# Patient Record
Sex: Male | Born: 1990 | Hispanic: No | Marital: Single | Smoking: Light tobacco smoker
Health system: Southern US, Community
[De-identification: ages and names within clinical notes are randomized; demographics above are authoritative.]

## PROBLEM LIST (undated history)

## (undated) DIAGNOSIS — K259 Gastric ulcer, unspecified as acute or chronic, without hemorrhage or perforation: Secondary | ICD-10-CM

## (undated) DIAGNOSIS — K922 Gastrointestinal hemorrhage, unspecified: Secondary | ICD-10-CM

---
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
## 2012-08-04 ENCOUNTER — Emergency Department (HOSPITAL_COMMUNITY)
Admission: EM | Admit: 2012-08-04 | Discharge: 2012-08-04 | Disposition: A | Discharge status: Home / Self Care | Payer: BC Managed Care – PPO | Attending: Emergency Medicine | Admitting: Emergency Medicine

## 2012-08-04 ENCOUNTER — Encounter (HOSPITAL_COMMUNITY): Payer: Self-pay | Admitting: *Deleted

## 2012-08-04 DIAGNOSIS — J45909 Unspecified asthma, uncomplicated: Secondary | ICD-10-CM | POA: Insufficient documentation

## 2012-08-04 DIAGNOSIS — R509 Fever, unspecified: Secondary | ICD-10-CM | POA: Insufficient documentation

## 2012-08-04 DIAGNOSIS — R599 Enlarged lymph nodes, unspecified: Secondary | ICD-10-CM | POA: Insufficient documentation

## 2012-08-04 DIAGNOSIS — R05 Cough: Secondary | ICD-10-CM | POA: Insufficient documentation

## 2012-08-04 DIAGNOSIS — R6883 Chills (without fever): Secondary | ICD-10-CM | POA: Insufficient documentation

## 2012-08-04 DIAGNOSIS — J020 Streptococcal pharyngitis: Secondary | ICD-10-CM | POA: Insufficient documentation

## 2012-08-04 DIAGNOSIS — R52 Pain, unspecified: Secondary | ICD-10-CM | POA: Insufficient documentation

## 2012-08-04 DIAGNOSIS — R059 Cough, unspecified: Secondary | ICD-10-CM | POA: Insufficient documentation

## 2012-08-04 LAB — RAPID STREP SCREEN (MED CTR MEBANE ONLY): Streptococcus, Group A Screen (Direct): POSITIVE — AB

## 2012-08-04 MED ORDER — DEXAMETHASONE SODIUM PHOSPHATE 10 MG/ML IJ SOLN
10.0000 mg | Freq: Once | INTRAMUSCULAR | Status: AC
  Administered 2012-08-04: 10 mg via INTRAMUSCULAR
  Filled 2012-08-04: qty 1

## 2012-08-04 MED ORDER — HYDROCODONE-HOMATROPINE 5-1.5 MG/5ML PO SYRP: 2.5000 mL | ORAL_SOLUTION | Freq: Four times a day (QID) | ORAL | Status: DC | PRN

## 2012-08-04 MED ORDER — AMOXICILLIN 500 MG PO CAPS: 500.0000 mg | ORAL_CAPSULE | Freq: Two times a day (BID) | ORAL | Status: DC

## 2012-08-04 NOTE — ED Notes (Signed)
Please see triage note

## 2012-08-04 NOTE — ED Notes (Signed)
Pt reports feeling "really" hot with chills and sore throat since last night.  Pt also reports body aches which gets better when he's moving around.

## 2012-08-04 NOTE — ED Provider Notes (Signed)
History  This chart was scribed for non-physician practitioner Arthor Captain, PA-C working with Richardean Canal, MD, by Candelaria Stagers, ED Scribe. This patient was seen in room WTR5/WTR5 and the patient's care was started at 4:30 PM   CSN: [id number]  Arrival date & time 08/04/12  1527   First MD Initiated Contact with Patient 08/04/12 1552      Chief Complaint  Patient presents with  . Sore Throat  . Chills    The history is provided by the patient. No language interpreter was used.   HPI Comments: Joel Compton is a 22 y.o. male who presents to the Emergency Department complaining of sore throat that started two days ago.  He is also experiencing a subjective fever, generalized body aches, chills, and a productive cough.  Pt denies h/o strep.  He denies recent travel out of the country.  He has tried nothing for the symptoms.     Past Medical History  Diagnosis Date  . Asthma     History reviewed. No pertinent past surgical history.  No family history on file.  History  Substance Use Topics  . Smoking status: Never Smoker   . Smokeless tobacco: Not on file  . Alcohol Use: Yes     Comment: occa      Review of Systems  Constitutional: Positive for fever and chills.  HENT: Positive for sore throat.   All other systems reviewed and are negative.    Allergies  Review of patient's allergies indicates no known allergies.  Home Medications   Current Outpatient Rx  Name  Route  Sig  Dispense  Refill  . famotidine (PEPCID) 20 MG tablet   Oral   Take 1 tablet (20 mg total) by mouth 2 (two) times daily.   30 tablet   0     Take 1 tablet twice a day for 7 days, then 1 table ...     BP 113/66  Pulse 78  Temp(Src) 99.5 F (37.5 C)  Resp 20  SpO2 100%  Physical Exam  Nursing note and vitals reviewed. Constitutional: He is oriented to person, place, and time. He appears well-developed and well-nourished. No distress.  HENT:  Head: Normocephalic and  atraumatic.  Bilaterally tonsillar swelling without deviation.  Exudate on tonsils.    Eyes: EOM are normal.  Neck: Neck supple. No tracheal deviation present.  Cardiovascular: Normal rate.   Pulmonary/Chest: Effort normal. No respiratory distress.  Musculoskeletal: Normal range of motion.  Lymphadenopathy:    He has cervical adenopathy.  Neurological: He is alert and oriented to person, place, and time.  Skin: Skin is warm and dry.  Psychiatric: He has a normal mood and affect. His behavior is normal.    ED Course  Procedures  DIAGNOSTIC STUDIES: Oxygen Saturation is 100% on room air, normal by my interpretation.    COORDINATION OF CARE:   4:31 PM Dicussed course of care with pt which includes steroid shot in ED.  Pt understands and agrees.     Labs Reviewed  RAPID STREP SCREEN   No results found.   1. Strep throat       MDM  Pt afebrile with tonsillar exudate, cervical lymphadenopathy, & dysphagia; diagnosis of strep. Treated in the Ed with steroids, NSAIDs, Pain medication. Pt appears mildly dehydrated, discussed importance of water rehydration. Presentation non concerning for PTA or infxn spread to soft tissue. No trismus or uvula deviation. Specific return precautions discussed. Pt able to drink water in ED  without difficulty with intact air way. Recommended PCP follow up (resource guide given).  D/c with hycodan and amoxicillin.  I personally performed the services described in this documentation, which was scribed in my presence. The recorded information has been reviewed and is accurate.         Arthor Captain, PA-C 08/05/12 1610

## 2012-08-06 NOTE — ED Provider Notes (Signed)
Medical screening examination/treatment/procedure(s) were performed by non-physician practitioner and as supervising physician I was immediately available for consultation/collaboration.   Richardean Canal, MD 08/06/12 [phone]

## 2012-11-07 ENCOUNTER — Encounter (HOSPITAL_COMMUNITY): Payer: Self-pay | Admitting: *Deleted

## 2012-11-07 ENCOUNTER — Emergency Department (HOSPITAL_COMMUNITY)
Admission: EM | Admit: 2012-11-07 | Discharge: 2012-11-07 | Disposition: A | Discharge status: Home / Self Care | Payer: BC Managed Care – PPO | Attending: Emergency Medicine | Admitting: Emergency Medicine

## 2012-11-07 ENCOUNTER — Emergency Department (HOSPITAL_COMMUNITY): Payer: BC Managed Care – PPO

## 2012-11-07 DIAGNOSIS — J45909 Unspecified asthma, uncomplicated: Secondary | ICD-10-CM | POA: Insufficient documentation

## 2012-11-07 DIAGNOSIS — R112 Nausea with vomiting, unspecified: Secondary | ICD-10-CM | POA: Insufficient documentation

## 2012-11-07 DIAGNOSIS — R109 Unspecified abdominal pain: Secondary | ICD-10-CM | POA: Insufficient documentation

## 2012-11-07 DIAGNOSIS — K5900 Constipation, unspecified: Secondary | ICD-10-CM | POA: Insufficient documentation

## 2012-11-07 DIAGNOSIS — A6000 Herpesviral infection of urogenital system, unspecified: Secondary | ICD-10-CM

## 2012-11-07 LAB — COMPREHENSIVE METABOLIC PANEL
AST: 23 U/L (ref 0–37)
Albumin: 4.1 g/dL (ref 3.5–5.2)
BUN: 5 mg/dL — ABNORMAL LOW (ref 6–23)
Calcium: 9.5 mg/dL (ref 8.4–10.5)
Creatinine, Ser: 0.65 mg/dL (ref 0.50–1.35)
Total Bilirubin: 1.1 mg/dL (ref 0.3–1.2)
Total Protein: 8.1 g/dL (ref 6.0–8.3)

## 2012-11-07 LAB — CBC WITH DIFFERENTIAL/PLATELET
Basophils Absolute: 0 10*3/uL (ref 0.0–0.1)
Basophils Relative: 1 % (ref 0–1)
Eosinophils Absolute: 0.1 10*3/uL (ref 0.0–0.7)
Eosinophils Relative: 1 % (ref 0–5)
HCT: 46.6 % (ref 39.0–52.0)
Hemoglobin: 16.3 g/dL (ref 13.0–17.0)
MCH: 29.3 pg (ref 26.0–34.0)
MCHC: 35 g/dL (ref 30.0–36.0)
MCV: 83.7 fL (ref 78.0–100.0)
Monocytes Absolute: 0.3 10*3/uL (ref 0.1–1.0)
Monocytes Relative: 7 % (ref 3–12)
Neutro Abs: 2.4 10*3/uL (ref 1.7–7.7)
RDW: 14 % (ref 11.5–15.5)

## 2012-11-07 LAB — URINALYSIS, ROUTINE W REFLEX MICROSCOPIC
Glucose, UA: NEGATIVE mg/dL
Ketones, ur: NEGATIVE mg/dL
Nitrite: NEGATIVE
Protein, ur: NEGATIVE mg/dL
pH: 7.5 (ref 5.0–8.0)

## 2012-11-07 LAB — LIPASE, BLOOD: Lipase: 20 U/L (ref 11–59)

## 2012-11-07 LAB — URINE MICROSCOPIC-ADD ON

## 2012-11-07 MED ORDER — ONDANSETRON HCL 4 MG/2ML IJ SOLN
4.0000 mg | Freq: Once | INTRAMUSCULAR | Status: AC
  Administered 2012-11-07: 4 mg via INTRAVENOUS
  Filled 2012-11-07: qty 2

## 2012-11-07 MED ORDER — SODIUM CHLORIDE 0.9 % IV BOLUS (SEPSIS)
1000.0000 mL | Freq: Once | INTRAVENOUS | Status: AC
  Administered 2012-11-07: 1000 mL via INTRAVENOUS

## 2012-11-07 MED ORDER — VALACYCLOVIR HCL 1 G PO TABS: 1000.0000 mg | ORAL_TABLET | Freq: Three times a day (TID) | ORAL | Status: AC

## 2012-11-07 MED ORDER — METOCLOPRAMIDE HCL 10 MG PO TABS: 10.0000 mg | ORAL_TABLET | Freq: Four times a day (QID) | ORAL | Status: DC

## 2012-11-07 NOTE — ED Notes (Signed)
Pt reports penis pain x2 weeks. Reports he had a pimple pop on his penis and has had some discharge and dysuria. Also has left sided abdominal pain x3 days with nausea and dry heaves. Pain 6/10.

## 2012-11-07 NOTE — ED Provider Notes (Addendum)
CSN: [id number]     Arrival date & time 11/07/12  1605 History   First MD Initiated Contact with Patient 11/07/12 1707     Chief Complaint  Patient presents with  . Penis Pain  . Abdominal Pain   (Consider location/radiation/quality/duration/timing/severity/associated sxs/prior Treatment) The history is provided by the patient.  Joel Compton is a 22 y.o. male hx of asthma here with penile pain and discharge and constipation and abdominal pain. He is sexual active with one male partner and often do not use protection. He noticed penile pain for last 2 weeks. He said that he noticed some vesicles in his penis that popped and he had pain intermittently for the last 2 weeks. He said that his partner has a history of herpes. He also had some penile discharge but he had that previously and had negative GC chlamydia testing over the summer. He is also constipated and last bowel movement was several days ago. He has been having some left-sided abdominal pain is crampy nature which. He has some occasional dry heaving and nausea and vomited several times. No fevers or chills.    Past Medical History  Diagnosis Date  . Asthma    History reviewed. No pertinent past surgical history. History reviewed. No pertinent family history. History  Substance Use Topics  . Smoking status: Never Smoker   . Smokeless tobacco: Not on file  . Alcohol Use: Yes     Comment: occa    Review of Systems  Gastrointestinal: Positive for nausea, vomiting and abdominal pain.  Genitourinary: Positive for penile pain.  All other systems reviewed and are negative.    Allergies  Review of patient's allergies indicates no known allergies.  Home Medications   Current Outpatient Rx  Name  Route  Sig  Dispense  Refill  . ibuprofen (ADVIL,MOTRIN) 200 MG tablet   Oral   Take 400 mg by mouth every 8 (eight) hours as needed for pain.          BP 137/87  Pulse 66  Temp(Src) 98.4 F (36.9 C) (Oral)  Resp 16   SpO2 96% Physical Exam  Nursing note and vitals reviewed. Constitutional: He is oriented to person, place, and time. He appears well-developed and well-nourished.  HENT:  Head: Normocephalic.  Mouth/Throat: Oropharynx is clear and moist.  Eyes: Conjunctivae are normal. Pupils are equal, round, and reactive to light.  Neck: Normal range of motion. Neck supple.  Cardiovascular: Normal rate, regular rhythm and normal heart sounds.   Pulmonary/Chest: Effort normal and breath sounds normal. No respiratory distress. He has no wheezes. He has no rales.  Abdominal: Soft. Bowel sounds are normal.  + mild LLQ tenderness, no rebound   Genitourinary:  Minimal discharge from penis. Vesicles on the base of penis and base of glans of penis. No LAD no scrotal tenderness   Musculoskeletal: Normal range of motion.  Neurological: He is alert and oriented to person, place, and time.  Skin: Skin is warm and dry.  Psychiatric: He has a normal mood and affect. His behavior is normal. Judgment and thought content normal.    ED Course  Procedures (including critical care time) Labs Review Labs Reviewed  COMPREHENSIVE METABOLIC PANEL - Abnormal; Notable for the following:    Sodium 134 (*)    BUN 5 (*)    All other components within normal limits  URINALYSIS, ROUTINE W REFLEX MICROSCOPIC - Abnormal; Notable for the following:    Leukocytes, UA SMALL (*)    All  other components within normal limits  GC/CHLAMYDIA PROBE AMP  CBC WITH DIFFERENTIAL  LIPASE, BLOOD  URINE MICROSCOPIC-ADD ON   Imaging Review Dg Abd 2 Views  11/07/2012   CLINICAL DATA:  Abdominal pain and constipation  EXAM: ABDOMEN - 2 VIEW  COMPARISON:  11/03/2011  FINDINGS: The bowel gas pattern is normal. There is no evidence of free air. No radiographic evidence of increased stool. No radio-opaque calculi or other significant radiographic abnormality is seen.  IMPRESSION: Negative.   Electronically Signed   By: Amie Portland   On:  11/07/2012 17:36    MDM  No diagnosis found. Joel Compton is a 22 y.o. male here with penile pain and constipation. Penile pain likely from herpes. GC/chlamydia sent and patient doesn't want to get empiric treatment and rather wait for results. Will get xray to see how constipated he is. Will hydrated and reassess.   7:01 PM Xray showed some constipated. Labs unremarkable. Tolerated PO in the ED. 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 Return precautions given.   Richardean Canal, MD 11/07/12 1902  Richardean Canal, MD 11/07/12 Mikle Bosworth

## 2012-11-07 NOTE — ED Notes (Signed)
Patient given ginger ale per his request. EDP gave verbal order for po chanllenge.

## 2012-11-08 LAB — GC/CHLAMYDIA PROBE AMP: CT Probe RNA: NEGATIVE

## 2013-06-16 ENCOUNTER — Emergency Department (HOSPITAL_COMMUNITY): Payer: Medicaid Other

## 2013-06-16 ENCOUNTER — Emergency Department (HOSPITAL_COMMUNITY)
Admission: EM | Admit: 2013-06-16 | Discharge: 2013-06-16 | Disposition: A | Discharge status: Home / Self Care | Payer: Medicaid Other | Attending: Emergency Medicine | Admitting: Emergency Medicine

## 2013-06-16 DIAGNOSIS — J45909 Unspecified asthma, uncomplicated: Secondary | ICD-10-CM | POA: Insufficient documentation

## 2013-06-16 DIAGNOSIS — A6002 Herpesviral infection of other male genital organs: Secondary | ICD-10-CM

## 2013-06-16 DIAGNOSIS — A6000 Herpesviral infection of urogenital system, unspecified: Secondary | ICD-10-CM | POA: Insufficient documentation

## 2013-06-16 DIAGNOSIS — J069 Acute upper respiratory infection, unspecified: Secondary | ICD-10-CM | POA: Insufficient documentation

## 2013-06-16 LAB — I-STAT CHEM 8, ED
BUN: 15 mg/dL (ref 6–23)
CHLORIDE: 99 meq/L (ref 96–112)
CREATININE: 0.9 mg/dL (ref 0.50–1.35)
Calcium, Ion: 1.18 mmol/L (ref 1.12–1.23)
GLUCOSE: 84 mg/dL (ref 70–99)
HCT: 47 % (ref 39.0–52.0)
Hemoglobin: 16 g/dL (ref 13.0–17.0)
POTASSIUM: 4 meq/L (ref 3.7–5.3)
Sodium: 137 mEq/L (ref 137–147)
TCO2: 27 mmol/L (ref 0–100)

## 2013-06-16 MED ORDER — LORATADINE 10 MG PO TABS: 10.0000 mg | ORAL_TABLET | Freq: Every day | ORAL | Status: DC

## 2013-06-16 MED ORDER — AZITHROMYCIN 250 MG PO TABS: 250.0000 mg | ORAL_TABLET | Freq: Every day | ORAL | Status: DC

## 2013-06-16 MED ORDER — ALBUTEROL SULFATE HFA 108 (90 BASE) MCG/ACT IN AERS
2.0000 | INHALATION_SPRAY | RESPIRATORY_TRACT | Status: DC | PRN
  Administered 2013-06-16: 2 via RESPIRATORY_TRACT
  Filled 2013-06-16: qty 6.7

## 2013-06-16 MED ORDER — VALACYCLOVIR HCL 1 G PO TABS: 1000.0000 mg | ORAL_TABLET | Freq: Three times a day (TID) | ORAL | Status: AC

## 2013-06-16 MED ORDER — HYDROCOD POLST-CHLORPHEN POLST 10-8 MG/5ML PO LQCR
5.0000 mL | Freq: Once | ORAL | Status: AC
Start: 2013-06-16 — End: 2013-06-16
  Administered 2013-06-16: 5 mL via ORAL
  Filled 2013-06-16: qty 5

## 2013-06-16 NOTE — ED Notes (Signed)
Pt states that he started having chest pain after coughing for 2 weeks. Pt states that pain is mid chest and feels pressure, and is worse in the am. Pt currently rates pain at 2 on a scale from 1-10.

## 2013-06-16 NOTE — ED Notes (Signed)
EKG given to EDP, Horton,MD., for review. 

## 2013-06-16 NOTE — Discharge Instructions (Signed)
Upper Respiratory Infection, Adult °An upper respiratory infection (URI) is also sometimes known as the common cold. The upper respiratory tract includes the nose, sinuses, throat, trachea, and bronchi. Bronchi are the airways leading to the lungs. Most people improve within 1 week, but symptoms can last up to 2 weeks. A residual cough may last even longer.  °CAUSES °Many different viruses can infect the tissues lining the upper respiratory tract. The tissues become irritated and inflamed and often become very moist. Mucus production is also common. A cold is contagious. You can easily spread the virus to others by oral contact. This includes kissing, sharing a glass, coughing, or sneezing. Touching your mouth or nose and then touching a surface, which is then touched by another person, can also spread the virus. °SYMPTOMS  °Symptoms typically develop 1 to 3 days after you come in contact with a cold virus. Symptoms vary from person to person. They may include: °· Runny nose. °· Sneezing. °· Nasal congestion. °· Sinus irritation. °· Sore throat. °· Loss of voice (laryngitis). °· Cough. °· Fatigue. °· Muscle aches. °· Loss of appetite. °· Headache. °· Low-grade fever. °DIAGNOSIS  °You might diagnose your own cold based on familiar symptoms, since most people get a cold 2 to 3 times a year. Your caregiver can confirm this based on your exam. Most importantly, your caregiver can check that your symptoms are not due to another disease such as strep throat, sinusitis, pneumonia, asthma, or epiglottitis. Blood tests, throat tests, and X-rays are not necessary to diagnose a common cold, but they may sometimes be helpful in excluding other more serious diseases. Your caregiver will decide if any further tests are required. °RISKS AND COMPLICATIONS  °You may be at risk for a more severe case of the common cold if you smoke cigarettes, have chronic heart disease (such as heart failure) or lung disease (such as asthma), or if  you have a weakened immune system. The very young and very old are also at risk for more serious infections. Bacterial sinusitis, middle ear infections, and bacterial pneumonia can complicate the common cold. The common cold can worsen asthma and chronic obstructive pulmonary disease (COPD). Sometimes, these complications can require emergency medical care and may be life-threatening. °PREVENTION  °The best way to protect against getting a cold is to practice good hygiene. Avoid oral or hand contact with people with cold symptoms. Wash your hands often if contact occurs. There is no clear evidence that vitamin C, vitamin E, echinacea, or exercise reduces the chance of developing a cold. However, it is always recommended to get plenty of rest and practice good nutrition. °TREATMENT  °Treatment is directed at relieving symptoms. There is no cure. Antibiotics are not effective, because the infection is caused by a virus, not by bacteria. Treatment may include: °· Increased fluid intake. Sports drinks offer valuable electrolytes, sugars, and fluids. °· Breathing heated mist or steam (vaporizer or shower). °· Eating chicken soup or other clear broths, and maintaining good nutrition. °· Getting plenty of rest. °· Using gargles or lozenges for comfort. °· Controlling fevers with ibuprofen or acetaminophen as directed by your caregiver. °· Increasing usage of your inhaler if you have asthma. °Zinc gel and zinc lozenges, taken in the first 24 hours of the common cold, can shorten the duration and lessen the severity of symptoms. Pain medicines may help with fever, muscle aches, and throat pain. A variety of non-prescription medicines are available to treat congestion and runny nose. Your caregiver   can make recommendations and may suggest nasal or lung inhalers for other symptoms.  HOME CARE INSTRUCTIONS   Only take over-the-counter or prescription medicines for pain, discomfort, or fever as directed by your  caregiver.  Use a warm mist humidifier or inhale steam from a shower to increase air moisture. This may keep secretions moist and make it easier to breathe.  Drink enough water and fluids to keep your urine clear or pale yellow.  Rest as needed.  Return to work when your temperature has returned to normal or as your caregiver advises. You may need to stay home longer to avoid infecting others. You can also use a face mask and careful hand washing to prevent spread of the virus. SEEK MEDICAL CARE IF:   After the first few days, you feel you are getting worse rather than better.  You need your caregiver's advice about medicines to control symptoms.  You develop chills, worsening shortness of breath, or brown or red sputum. These may be signs of pneumonia.  You develop yellow or brown nasal discharge or pain in the face, especially when you bend forward. These may be signs of sinusitis.  You develop a fever, swollen neck glands, pain with swallowing, or white areas in the back of your throat. These may be signs of strep throat. SEEK IMMEDIATE MEDICAL CARE IF:   You have a fever.  You develop severe or persistent headache, ear pain, sinus pain, or chest pain.  You develop wheezing, a prolonged cough, cough up blood, or have a change in your usual mucus (if you have chronic lung disease).  You develop sore muscles or a stiff neck. Document Released: 08/05/2000 Document Revised: 05/04/2011 Document Reviewed: 06/13/2010 [facility] Patient Information 2014 [place] LakeExitCare, [place].    Genital Herpes Genital herpes is a sexually transmitted disease. This means that it is a disease passed by having sex with an infected person. There is no cure for genital herpes. The time between attacks can be months to years. The virus may live in a person but produce no problems (symptoms). This infection can be passed to a baby as it travels down the birth canal (vagina). In a newborn, this can cause central  nervous system damage, eye damage, or even death. The virus that causes genital herpes is usually HSV-2 virus. The virus that causes oral herpes is usually HSV-1. The diagnosis (learning what is wrong) is made through culture results. SYMPTOMS  Usually symptoms of pain and itching begin a few days to a week after contact. It first appears as small blisters that progress to small painful ulcers which then scab over and heal after several days. It affects the outer genitalia, birth canal, cervix, penis, anal area, buttocks, and thighs. HOME CARE INSTRUCTIONS   Keep ulcerated areas dry and clean.  Take medications as directed. Antiviral medications can speed up healing. They will not prevent recurrences or cure this infection. These medications can also be taken for suppression if there are frequent recurrences.  While the infection is active, it is contagious. Avoid all sexual contact during active infections.  Condoms may help prevent spread of the herpes virus.  Practice safe sex.  Wash your hands thoroughly after touching the genital area.  Avoid touching your eyes after touching your genital area.  Inform your caregiver if you have had genital herpes and become pregnant. It is your responsibility to insure a safe outcome for your baby in this pregnancy.  Only take over-the-counter or prescription medicines for pain, discomfort,  or fever as directed by your caregiver. SEEK MEDICAL CARE IF:   You have a recurrence of this infection.  You do not respond to medications and are not improving.  You have new sources of pain or discharge which have changed from the original infection.  You have an oral temperature above 102 F (38.9 C).  You develop abdominal pain.  You develop eye pain or signs of eye infection. Document Released: 02/07/2000 Document Revised: 05/04/2011 Document Reviewed: 02/27/2009 [facility] CenterExitCare Patient Information 2014 [place]ExitCare, [place]LL[place].

## 2013-06-16 NOTE — ED Provider Notes (Signed)
CSN: [id number]     Arrival date & time 06/16/13  2129 History   First MD Initiated Contact with Patient 06/16/13 2147     Chief Complaint  Patient presents with  . Chest Pain     (Consider location/radiation/quality/duration/timing/severity/associated sxs/prior Treatment) Patient is a 23 y.o. male presenting with cough. The history is provided by the patient.  Cough Cough characteristics:  Paroxysmal and non-productive Severity:  Moderate Onset quality:  Sudden Duration:  2 weeks Timing:  Intermittent Progression:  Unchanged Chronicity:  New Smoker: no   Context: exposure to allergens, upper respiratory infection and weather changes   Relieved by:  Cough suppressants and decongestant Worsened by:  Environmental changes Ineffective treatments:  Decongestant Associated symptoms: chest pain, rash, sinus congestion and sore throat   Associated symptoms: no fever, no headaches and no shortness of breath     Patient to the ED with complaints of chest tenderness after coughing for 2 weeks. He is an asthmatic and has beed taking Cepacol, ibuprofen and Nyquil for his symptoms but they have not resolved.   Past Medical History  Diagnosis Date  . Asthma    No past surgical history on file. No family history on file. History  Substance Use Topics  . Smoking status: Never Smoker   . Smokeless tobacco: Not on file  . Alcohol Use: Yes     Comment: occa    Review of Systems  Constitutional: Negative for fever.  HENT: Positive for sore throat.   Respiratory: Positive for cough. Negative for shortness of breath.   Cardiovascular: Positive for chest pain.  Skin: Positive for rash.  Neurological: Negative for headaches.      Allergies  Review of patient's allergies indicates no known allergies.  Home Medications   Prior to Admission medications   Medication Sig Start Date End Date Taking? Authorizing Provider  ibuprofen (ADVIL,MOTRIN) 200 MG tablet Take 800 mg by mouth  every 8 (eight) hours as needed (pain).    Yes Historical Provider, MD  menthol-cetylpyridinium (CEPACOL) 3 MG lozenge Take 1 lozenge by mouth as needed for sore throat (sore throat).   Yes Historical Provider, MD  Pseudoeph-Doxylamine-DM-APAP (NYQUIL MULTI-SYMPTOM PO) Take 30 mLs by mouth daily as needed (flu-like symptoms).   Yes Historical Provider, MD   BP 133/82  Pulse 60  Temp(Src) 98.7 F (37.1 C) (Oral)  Resp 16  SpO2 100% Physical Exam  Nursing note and vitals reviewed. Constitutional: He appears well-developed and well-nourished. No distress.  HENT:  Head: Normocephalic and atraumatic.  Nose: Rhinorrhea present. Right sinus exhibits frontal sinus tenderness. Left sinus exhibits frontal sinus tenderness.  Eyes: Pupils are equal, round, and reactive to light.  Neck: Normal range of motion. Neck supple.  Cardiovascular: Normal rate, regular rhythm and normal heart sounds.  Exam reveals no gallop and no friction rub.   Pulmonary/Chest: Effort normal.  Abdominal: Soft.  Neurological: He is alert.  Skin: Skin is warm and dry.    ED Course  Procedures (including critical care time) Labs Review Labs Reviewed  I-STAT CHEM 8, ED    Imaging Review Dg Chest Port 1 View  06/16/2013   CLINICAL DATA:  Mid chest pain and cough.  History of asthma.  EXAM: PORTABLE CHEST - 1 VIEW  COMPARISON:  None.  FINDINGS: The lungs are well-aerated and clear. There is no evidence of focal opacification, pleural effusion or pneumothorax.  The cardiomediastinal silhouette is within normal limits. No acute osseous abnormalities are seen.  IMPRESSION: No acute cardiopulmonary  process seen.   Electronically Signed   By: Roanna RaiderJeffery  Chang M.D.   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 A EKG was done in triage, which showed signs of possible pericarditis but the patient ONLY has pains when he coughs, no pain to palpation, and no fevers, weakness, palpations. He has nasal congestion and a rash to his penis. The clinical presentation does not correlate with acute pericarditis.  23 y.o.Andree CossIbrahim A Want's evaluation in the Emergency Department is complete. It has been determined that no acute conditions requiring further emergency intervention are present at this time. The patient/guardian have been advised of the diagnosis and plan. We have discussed signs and symptoms that warrant return to the ED, such as changes or worsening in symptoms.  Vital signs are stable at discharge. Filed Vitals:   06/16/13 2340  BP: 133/82  Pulse:   Temp: 98.7 F (37.1 C)  Resp: 16    Patient/guardian has voiced understanding and agreed to follow-up with the PCP or specialist.    Dorthula Matasiffany G Darnell Jeschke, PA-C 06/17/13 2329

## 2013-06-19 NOTE — ED Provider Notes (Signed)
Medical screening examination/treatment/procedure(s) were performed by non-physician practitioner and as supervising physician I was immediately available for consultation/collaboration.   EKG Interpretation   Date/Time:  Friday June 16 2013 21:46:16 EDT Ventricular Rate:  62 PR Interval:  154 QRS Duration: 86 QT Interval:  389 QTC Calculation: 395 R Axis:   88 Text Interpretation:  Sinus rhythm RSR' in V1 or V2, right VCD or RVH ST  elevation suggests acute pericarditis Confirmed by Wilkie AyeHORTON  MD, COURTNEY  [phone]) on 06/16/2013 9:47:55 PM        Joel HaitWilliam Gabriell Daigneault, MD 06/19/13 2207

## 2013-07-04 ENCOUNTER — Emergency Department (HOSPITAL_COMMUNITY): Payer: Medicaid Other

## 2013-07-04 ENCOUNTER — Encounter (HOSPITAL_COMMUNITY): Payer: Self-pay | Admitting: Emergency Medicine

## 2013-07-04 ENCOUNTER — Emergency Department (HOSPITAL_COMMUNITY)
Admission: EM | Admit: 2013-07-04 | Discharge: 2013-07-04 | Disposition: A | Discharge status: Home / Self Care | Payer: Medicaid Other | Attending: Emergency Medicine | Admitting: Emergency Medicine

## 2013-07-04 DIAGNOSIS — S93409A Sprain of unspecified ligament of unspecified ankle, initial encounter: Secondary | ICD-10-CM | POA: Insufficient documentation

## 2013-07-04 DIAGNOSIS — S93401A Sprain of unspecified ligament of right ankle, initial encounter: Secondary | ICD-10-CM

## 2013-07-04 DIAGNOSIS — Y9239 Other specified sports and athletic area as the place of occurrence of the external cause: Secondary | ICD-10-CM | POA: Insufficient documentation

## 2013-07-04 DIAGNOSIS — Y9367 Activity, basketball: Secondary | ICD-10-CM | POA: Insufficient documentation

## 2013-07-04 DIAGNOSIS — J45909 Unspecified asthma, uncomplicated: Secondary | ICD-10-CM | POA: Insufficient documentation

## 2013-07-04 DIAGNOSIS — Y92838 Other recreation area as the place of occurrence of the external cause: Secondary | ICD-10-CM

## 2013-07-04 DIAGNOSIS — X500XXA Overexertion from strenuous movement or load, initial encounter: Secondary | ICD-10-CM | POA: Insufficient documentation

## 2013-07-04 DIAGNOSIS — Z79899 Other long term (current) drug therapy: Secondary | ICD-10-CM | POA: Insufficient documentation

## 2013-07-04 NOTE — Discharge Instructions (Signed)

## 2013-07-04 NOTE — ED Notes (Signed)
Presents with right ankle injury after playing basketball, right ankle edema noted. Cms intact.

## 2013-07-04 NOTE — ED Provider Notes (Signed)
CSN: [id number]     Arrival date & time 07/04/13  0009 History   First MD Initiated Contact with Patient 07/04/13 0602     Chief Complaint  Patient presents with  . Ankle Pain     (Consider location/radiation/quality/duration/timing/severity/associated sxs/prior Treatment) HPI Is reports inversion injury to his right ankle one week ago.  Initially the swelling and pain improved denies had ongoing pain in his right lateral malleolus.  He reports pain with ambulation.  He reports the swelling is improved.  No redness.  No fevers or chills.  No other complaints   Past Medical History  Diagnosis Date  . Asthma    History reviewed. No pertinent past surgical history. History reviewed. No pertinent family history. History  Substance Use Topics  . Smoking status: Never Smoker   . Smokeless tobacco: Not on file  . Alcohol Use: Yes     Comment: occa    Review of Systems  All other systems reviewed and are negative.     Allergies  Review of patient's allergies indicates no known allergies.  Home Medications   Prior to Admission medications   Medication Sig Start Date End Date Taking? Authorizing Provider  albuterol (PROVENTIL HFA;VENTOLIN HFA) 108 (90 BASE) MCG/ACT inhaler Inhale 1 puff into the lungs every 6 (six) hours as needed for wheezing or shortness of breath.   Yes Historical Provider, MD  ibuprofen (ADVIL,MOTRIN) 200 MG tablet Take 800 mg by mouth every 8 (eight) hours as needed (pain).    Yes Historical Provider, MD  loratadine (CLARITIN) 10 MG tablet Take 1 tablet (10 mg total) by mouth daily. 06/16/13  Yes Tiffany Irine SealG Greene, PA-C  valACYclovir (VALTREX) 500 MG tablet Take 500 mg by mouth 3 (three) times daily.   Yes Historical Provider, MD   BP 132/76  Pulse 69  Temp(Src) 97.8 F (36.6 C) (Oral)  Resp 14  Ht 6' (1.829 m)  Wt 147 lb (66.679 kg)  BMI 19.93 kg/m2  SpO2 100% Physical Exam  Nursing note and vitals reviewed. Constitutional: He is oriented to person,  place, and time. He appears well-developed and well-nourished.  HENT:  Head: Normocephalic.  Eyes: EOM are normal.  Neck: Normal range of motion.  Pulmonary/Chest: Effort normal.  Abdominal: He exhibits no distension.  Musculoskeletal: Normal range of motion.  Normal PT and DP pulse the right foot.  Compartments of the right foot are soft.  No tenderness at the base of the right fifth metatarsal.  Mild tenderness of the right lateral malleolus.  Neurological: He is alert and oriented to person, place, and time.  Psychiatric: He has a normal mood and affect.    ED Course  Procedures (including critical care time) Labs Review Labs Reviewed - No data to display  Imaging Review Dg Ankle Complete Right  07/04/2013   CLINICAL DATA:  Traumatic injury with pain  EXAM: RIGHT ANKLE - COMPLETE 3+ VIEW  COMPARISON:  None.  FINDINGS: There is no evidence of fracture, dislocation, or joint effusion. There is no evidence of arthropathy or other focal bone abnormality. Soft tissues are unremarkable.  IMPRESSION: No acute abnormality noted.   Electronically Signed   By: Alcide CleverMark  Lukens M.D.   On: 07/04/2013 02:27     EKG Interpretation None      MDM   Final diagnoses:  Right ankle sprain    Ankle sprain.  Orthopedic/sports medicine followup    Lyanne CoKevin M Luverne Farone, MD 07/04/13 [phone]

## 2014-06-06 ENCOUNTER — Encounter (HOSPITAL_COMMUNITY): Payer: Self-pay

## 2014-06-06 ENCOUNTER — Emergency Department (HOSPITAL_COMMUNITY): Payer: Medicaid Other

## 2014-06-06 ENCOUNTER — Emergency Department (HOSPITAL_COMMUNITY)
Admission: EM | Admit: 2014-06-06 | Discharge: 2014-06-06 | Disposition: A | Discharge status: Home / Self Care | Payer: Medicaid Other | Attending: Emergency Medicine | Admitting: Emergency Medicine

## 2014-06-06 DIAGNOSIS — J209 Acute bronchitis, unspecified: Secondary | ICD-10-CM

## 2014-06-06 DIAGNOSIS — J45901 Unspecified asthma with (acute) exacerbation: Secondary | ICD-10-CM | POA: Insufficient documentation

## 2014-06-06 DIAGNOSIS — R519 Headache, unspecified: Secondary | ICD-10-CM

## 2014-06-06 DIAGNOSIS — R51 Headache: Secondary | ICD-10-CM

## 2014-06-06 DIAGNOSIS — Z79899 Other long term (current) drug therapy: Secondary | ICD-10-CM | POA: Insufficient documentation

## 2014-06-06 LAB — CBC
HEMATOCRIT: 47 % (ref 39.0–52.0)
HEMOGLOBIN: 16.4 g/dL (ref 13.0–17.0)
MCH: 30.4 pg (ref 26.0–34.0)
MCHC: 34.9 g/dL (ref 30.0–36.0)
MCV: 87.2 fL (ref 78.0–100.0)
Platelets: 243 10*3/uL (ref 150–400)
RBC: 5.39 MIL/uL (ref 4.22–5.81)
RDW: 12.3 % (ref 11.5–15.5)
WBC: 4.6 10*3/uL (ref 4.0–10.5)

## 2014-06-06 LAB — D-DIMER, QUANTITATIVE (NOT AT ARMC)

## 2014-06-06 LAB — BASIC METABOLIC PANEL
ANION GAP: 9 (ref 5–15)
BUN: 9 mg/dL (ref 6–23)
CHLORIDE: 101 mmol/L (ref 96–112)
CO2: 26 mmol/L (ref 19–32)
Calcium: 9 mg/dL (ref 8.4–10.5)
Creatinine, Ser: 0.7 mg/dL (ref 0.50–1.35)
GFR calc Af Amer: >90 mL/min (ref >90)
GFR calc non Af Amer: >90 mL/min (ref >90)
GLUCOSE: 92 mg/dL (ref 70–99)
Potassium: 3.8 mmol/L (ref 3.5–5.1)
SODIUM: 136 mmol/L (ref 135–145)

## 2014-06-06 MED ORDER — IBUPROFEN 600 MG PO TABS: 600.0000 mg | ORAL_TABLET | Freq: Three times a day (TID) | ORAL | Status: DC | PRN

## 2014-06-06 MED ORDER — IBUPROFEN 800 MG PO TABS
800.0000 mg | ORAL_TABLET | Freq: Once | ORAL | Status: AC
  Administered 2014-06-06: 800 mg via ORAL
  Filled 2014-06-06: qty 1

## 2014-06-06 MED ORDER — METOCLOPRAMIDE HCL 5 MG/ML IJ SOLN
10.0000 mg | Freq: Once | INTRAMUSCULAR | Status: AC
  Administered 2014-06-06: 10 mg via INTRAVENOUS
  Filled 2014-06-06: qty 2

## 2014-06-06 MED ORDER — ALBUTEROL SULFATE HFA 108 (90 BASE) MCG/ACT IN AERS
2.0000 | INHALATION_SPRAY | Freq: Once | RESPIRATORY_TRACT | Status: AC
  Administered 2014-06-06: 2 via RESPIRATORY_TRACT
  Filled 2014-06-06: qty 6.7

## 2014-06-06 MED ORDER — MORPHINE SULFATE 4 MG/ML IJ SOLN
4.0000 mg | Freq: Once | INTRAMUSCULAR | Status: AC
  Administered 2014-06-06: 4 mg via INTRAVENOUS
  Filled 2014-06-06: qty 1

## 2014-06-06 NOTE — ED Notes (Signed)
Pt states chest pain since last night. Worse with movement. C/O cough, productive. Pt also states he has increased ability to smell, headache and hearing increase. Pt denies fever

## 2014-06-06 NOTE — ED Notes (Signed)
Per Zammit, hold chest pain labs.

## 2014-06-06 NOTE — ED Notes (Signed)
Pt escorted to discharge window. Pt verbalized understanding discharge instructions. In no acute distress.  

## 2014-06-06 NOTE — ED Provider Notes (Signed)
CSN: [id number]     Arrival date & time 06/06/14  1214 History   First MD Initiated Contact with Patient 06/06/14 1237     Chief Complaint  Patient presents with  . Chest Pain  . Cough  . Headache     HPI Patient is brought to the emergency department for increasing shortness of breath with productive cough over the past several days.  He states he used to require albuterol when he was a child.  He also reports headache at this time.  Reports chills without documented fever.  Denies neck pain or stiffness.  Headache is mild in severity at this time.  No history of asthma.  He has what sounds like reactive airway disease diagnosed when he was a child.  Has had mild productive cough.  No unilateral leg swelling.  No history DVT or pulmonary embolism.   Past Medical History  Diagnosis Date  . Asthma    History reviewed. No pertinent past surgical history. History reviewed. No pertinent family history. History  Substance Use Topics  . Smoking status: Never Smoker   . Smokeless tobacco: Not on file  . Alcohol Use: Yes     Comment: occa    Review of Systems  All other systems reviewed and are negative.     Allergies  Review of patient's allergies indicates no known allergies.  Home Medications   Prior to Admission medications   Medication Sig Start Date End Date Taking? Authorizing Provider  acetaminophen (TYLENOL) 500 MG tablet Take 1,000 mg by mouth every 6 (six) hours as needed for mild pain, moderate pain or headache.   Yes Historical Provider, MD  diphenhydrAMINE (SOMINEX) 25 MG tablet Take 25 mg by mouth at bedtime as needed for allergies or sleep.   Yes Historical Provider, MD  ibuprofen (ADVIL,MOTRIN) 800 MG tablet Take 800 mg by mouth every 8 (eight) hours as needed for headache, mild pain or moderate pain.   Yes Historical Provider, MD  Multiple Vitamins-Minerals (MULTIVITAMIN ADULT PO) Take 1 tablet by mouth daily.   Yes Historical Provider, MD  loratadine (CLARITIN)  10 MG tablet Take 1 tablet (10 mg total) by mouth daily. 06/16/13   Tiffany Neva Seat, PA-C   BP 132/71 mmHg  Pulse 89  Temp(Src) 99.3 F (37.4 C) (Oral)  Resp 20  SpO2 97% Physical Exam  Constitutional: He is oriented to person, place, and time. He appears well-developed and well-nourished.  HENT:  Head: Normocephalic and atraumatic.  Eyes: EOM are normal.  Neck: Normal range of motion.  No neck pain or stiffness  Cardiovascular: Normal rate, regular rhythm, normal heart sounds and intact distal pulses.   Pulmonary/Chest: Effort normal. No respiratory distress. He has wheezes.  Abdominal: Soft. He exhibits no distension. There is no tenderness.  Musculoskeletal: Normal range of motion.  Neurological: He is alert and oriented to person, place, and time.  Skin: Skin is warm and dry.  Psychiatric: He has a normal mood and affect. Judgment normal.  Nursing note and vitals reviewed.   ED Course  Procedures (including critical care time) Labs Review Labs Reviewed - No data to display  Imaging Review Dg Chest 2 View  06/06/2014   CLINICAL DATA:  Chest pain for 2 days.  Asthma.  Cough.  EXAM: CHEST  2 VIEW  COMPARISON:  06/16/2013  FINDINGS: The heart size and mediastinal contours are within normal limits. Both lungs are clear. No evidence of pleural effusion. No mass or lymphadenopathy identified. The visualized skeletal structures  are unremarkable.  IMPRESSION: Negative.  No active cardiopulmonary disease.   Electronically Signed   By: Myles RosenthalJohn  Stahl M.D.   On: 06/06/2014 13:38  I personally reviewed the imaging tests through PACS system I reviewed available ER/hospitalization records through the EMR    EKG Interpretation   Date/Time:  Wednesday June 06 2014 12:24:13 EDT Ventricular Rate:  83 PR Interval:  138 QRS Duration: 84 QT Interval:  350 QTC Calculation: 411 R Axis:   103 Text Interpretation:  Sinus rhythm Right atrial enlargement Borderline  right axis deviation  Borderline T wave abnormalities Baseline wander in  lead(s) V3 V4 non specific t wave changes Confirmed by Ark Agrusa  MD, Rachel Rison  ([id number]) on 06/06/2014 3:51:55 PM      MDM   Final diagnoses:  None    Likely bronchitis with bronchospasm.feels better after albuterol.  Headache improved with treatment of his low-grade fever here.  Dimer normal.  Hemoglobin normal.  Discharge home.    Azalia BilisKevin Altan Kraai, MD 06/06/14 [phone]

## 2014-06-06 NOTE — ED Notes (Signed)
Pt alert and oriented x4. Respirations even and unlabored, bilateral symmetrical rise and fall of chest. Skin warm and dry. In no acute distress. Denies needs.   

## 2014-07-30 ENCOUNTER — Emergency Department (HOSPITAL_COMMUNITY)
Admission: EM | Admit: 2014-07-30 | Discharge: 2014-07-30 | Disposition: A | Discharge status: Home / Self Care | Payer: Medicaid Other | Attending: Emergency Medicine | Admitting: Emergency Medicine

## 2014-07-30 ENCOUNTER — Encounter (HOSPITAL_COMMUNITY): Payer: Self-pay | Admitting: Emergency Medicine

## 2014-07-30 ENCOUNTER — Emergency Department (HOSPITAL_COMMUNITY): Payer: Medicaid Other

## 2014-07-30 DIAGNOSIS — S161XXA Strain of muscle, fascia and tendon at neck level, initial encounter: Secondary | ICD-10-CM | POA: Insufficient documentation

## 2014-07-30 DIAGNOSIS — Z79899 Other long term (current) drug therapy: Secondary | ICD-10-CM | POA: Insufficient documentation

## 2014-07-30 DIAGNOSIS — Y92410 Unspecified street and highway as the place of occurrence of the external cause: Secondary | ICD-10-CM | POA: Insufficient documentation

## 2014-07-30 DIAGNOSIS — Y9389 Activity, other specified: Secondary | ICD-10-CM | POA: Insufficient documentation

## 2014-07-30 DIAGNOSIS — Y998 Other external cause status: Secondary | ICD-10-CM | POA: Insufficient documentation

## 2014-07-30 MED ORDER — CYCLOBENZAPRINE HCL 10 MG PO TABS: 10.0000 mg | ORAL_TABLET | Freq: Three times a day (TID) | ORAL | Status: DC | PRN

## 2014-07-30 MED ORDER — IBUPROFEN 600 MG PO TABS: 600.0000 mg | ORAL_TABLET | Freq: Three times a day (TID) | ORAL | Status: DC | PRN

## 2014-07-30 MED ORDER — CYCLOBENZAPRINE HCL 10 MG PO TABS
5.0000 mg | ORAL_TABLET | Freq: Once | ORAL | Status: AC
  Administered 2014-07-30: 5 mg via ORAL
  Filled 2014-07-30: qty 1

## 2014-07-30 MED ORDER — KETOROLAC TROMETHAMINE 60 MG/2ML IM SOLN
60.0000 mg | Freq: Once | INTRAMUSCULAR | Status: AC
  Administered 2014-07-30: 60 mg via INTRAMUSCULAR
  Filled 2014-07-30: qty 2

## 2014-07-30 NOTE — ED Notes (Addendum)
Pt reports difficulty moving neck beginning this morning. Pt states he has been having severe allergies this year and is congested. Pt denies sore throat states he has been taking Claritin for allergies.
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
## 2014-07-30 NOTE — ED Notes (Signed)
Patient transported to X-ray 

## 2014-07-30 NOTE — Discharge Instructions (Signed)
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
## 2016-02-04 DIAGNOSIS — H52223 Regular astigmatism, bilateral: Secondary | ICD-10-CM | POA: Diagnosis not present

## 2016-07-30 ENCOUNTER — Encounter (HOSPITAL_BASED_OUTPATIENT_CLINIC_OR_DEPARTMENT_OTHER): Payer: Self-pay

## 2016-07-30 DIAGNOSIS — S0101XA Laceration without foreign body of scalp, initial encounter: Secondary | ICD-10-CM | POA: Insufficient documentation

## 2016-07-30 DIAGNOSIS — W01198A Fall on same level from slipping, tripping and stumbling with subsequent striking against other object, initial encounter: Secondary | ICD-10-CM | POA: Insufficient documentation

## 2016-07-30 DIAGNOSIS — Z79899 Other long term (current) drug therapy: Secondary | ICD-10-CM | POA: Insufficient documentation

## 2016-07-30 DIAGNOSIS — S0181XA Laceration without foreign body of other part of head, initial encounter: Secondary | ICD-10-CM | POA: Insufficient documentation

## 2016-07-30 DIAGNOSIS — Y9367 Activity, basketball: Secondary | ICD-10-CM | POA: Insufficient documentation

## 2016-07-30 DIAGNOSIS — Y999 Unspecified external cause status: Secondary | ICD-10-CM | POA: Insufficient documentation

## 2016-07-30 DIAGNOSIS — Y92310 Basketball court as the place of occurrence of the external cause: Secondary | ICD-10-CM | POA: Insufficient documentation

## 2016-07-30 NOTE — ED Triage Notes (Signed)
Pt was playing basketball and fell and hit head on the base of the goal post with LOC.  Pt has laceration to left forehead and top left scalp with hematoma surrounding forehead laceration

## 2016-07-31 ENCOUNTER — Emergency Department (HOSPITAL_BASED_OUTPATIENT_CLINIC_OR_DEPARTMENT_OTHER)
Admission: EM | Admit: 2016-07-31 | Discharge: 2016-07-31 | Disposition: A | Discharge status: Home / Self Care | Payer: Self-pay | Attending: Emergency Medicine | Admitting: Emergency Medicine

## 2016-07-31 ENCOUNTER — Emergency Department (HOSPITAL_BASED_OUTPATIENT_CLINIC_OR_DEPARTMENT_OTHER): Payer: Self-pay

## 2016-07-31 DIAGNOSIS — S0990XA Unspecified injury of head, initial encounter: Secondary | ICD-10-CM

## 2016-07-31 DIAGNOSIS — S0181XA Laceration without foreign body of other part of head, initial encounter: Secondary | ICD-10-CM

## 2016-07-31 DIAGNOSIS — S0101XA Laceration without foreign body of scalp, initial encounter: Secondary | ICD-10-CM

## 2016-07-31 MED ORDER — IBUPROFEN 800 MG PO TABS
800.0000 mg | ORAL_TABLET | Freq: Once | ORAL | Status: AC
  Administered 2016-07-31: 800 mg via ORAL
  Filled 2016-07-31: qty 1

## 2016-07-31 MED ORDER — LIDOCAINE-EPINEPHRINE 2 %-1:100000 IJ SOLN
20.0000 mL | Freq: Once | INTRAMUSCULAR | Status: AC
Start: 2016-07-31 — End: 2016-07-31
  Administered 2016-07-31: 20 mL
  Filled 2016-07-31: qty 1

## 2016-07-31 NOTE — Discharge Instructions (Signed)
Return to the ED with any concerns including vomiting, seizure activity, fainting, severe headache, pus draining from wound, increased redness around wound, fever/chills.  Sutures should come out in 3-5 days

## 2016-07-31 NOTE — ED Provider Notes (Signed)
[facility]   CSN: [id number] Arrival date & time: 07/30/16  2346     History   Chief Complaint Chief Complaint  Patient presents with  . Head Injury    HPI Joel Compton is a 26 y.o. male.  HPI  Pt presenting with concern for head injury.  Tonight just prior to arrival he was playing basketball with family and friends.  He was running towards the goal and fell, hitting his head on the goal post.  He did have brief LOC.  No vomiting or seizure activity.  He cut his forehead and the top of his head.  He currently c/o headache.  He was concerned due to the amount of bleeding from his head.  No changes in vision or speech.  There are no other associated systemic symptoms, there are no other alleviating or modifying factors.   History reviewed. No pertinent past medical history.  There are no active problems to display for this patient.   History reviewed. No pertinent surgical history.     Home Medications    Prior to Admission medications   Medication Sig Start Date End Date Taking? Authorizing Provider  cyclobenzaprine (FLEXERIL) 10 MG tablet Take 1 tablet (10 mg total) by mouth 3 (three) times daily as needed for muscle spasms. 07/30/14   Azalia Bilisampos, Kevin, MD  ibuprofen (ADVIL,MOTRIN) 600 MG tablet Take 1 tablet (600 mg total) by mouth every 8 (eight) hours as needed. 07/30/14   Azalia Bilisampos, Kevin, MD  loratadine (CLARITIN) 10 MG tablet Take 1 tablet (10 mg total) by mouth daily. 06/16/13   Marlon PelGreene, Tiffany, PA-C  Multiple Vitamins-Minerals (MULTIVITAMIN ADULT PO) Take 1 tablet by mouth daily.    [provider]    Family History No family history on file.  Social History Social History  Substance Use Topics  . Smoking status: Never Smoker  . Smokeless tobacco: Not on file  . Alcohol use Yes     Comment: occa     Allergies   Patient has no known allergies.   Review of Systems Review of Systems  ROS reviewed and all otherwise negative  except for mentioned in HPI   Physical Exam Updated Vital Signs BP 136/73 (BP Location: Left Arm)   Pulse 86   Temp 99.2 F (37.3 C) (Oral)   Resp 18   Ht 5\' 11"  (1.803 m)   Wt 80.7 kg (178 lb)   SpO2 100%   BMI 24.83 kg/m  Vitals reviewed Physical Exam  Physical Examination: General appearance - alert, well appearing, and in no distress Mental status - alert, oriented to person, place, and time Eyes - pupils equal and reactive, extraocular eye movements intact Head- contusion of left forehead, linear laceration to left forehead, left superior scalp laceration/abrasion Neck - no midline tenderness to palpation, FROM without pain Chest - clear to auscultation, no wheezes, rales or rhonchi, symmetric air entry Heart - normal rate, regular rhythm, normal S1, S2, no murmurs, rubs, clicks or gallops Back exam -no midline tenderness to palpation Neurological - alert, oriented, normal speech, GCS 15 Musculoskeletal - no joint tenderness, deformity or swelling Extremities - peripheral pulses normal, no pedal edema, no clubbing or cyanosis Skin - normal coloration and turgor, no rashes. Forehead laceration as noted above   ED Treatments / Results  Labs (all labs ordered are listed, but only abnormal results are displayed) Labs Reviewed - No data to display  EKG  EKG Interpretation None  Radiology Ct Head Wo Contrast  Result Date: 07/31/2016 CLINICAL DATA:  Injury playing basketball tonight. Fall striking head on goal, positive loss of consciousness. Left frontal laceration and hematoma. EXAM: CT HEAD WITHOUT CONTRAST TECHNIQUE: Contiguous axial images were obtained from the base of the skull through the vertex without intravenous contrast. COMPARISON:  None. FINDINGS: Brain: No evidence of acute infarction, hemorrhage, hydrocephalus, extra-axial collection or mass lesion/mass effect. Vascular: No hyperdense vessel or unexpected calcification. Skull: Left frontal scalp  laceration and hematoma. No skull fracture. Sinuses/Orbits: Paranasal sinuses and mastoid air cells are clear. The visualized orbits are unremarkable. Other: None. IMPRESSION: Left frontal scalp hematoma and laceration. No skull fracture or acute intracranial abnormality. Electronically Signed   By: Rubye Oaks M.D.   On: 07/31/2016 01:32    Procedures .Marland KitchenLaceration Repair Date/Time: 07/31/2016 3:28 AM Performed by: Jerelyn Scott Authorized by: Jerelyn Scott   Consent:    Consent obtained:  Verbal   Consent given by:  Patient   Risks discussed:  Infection and pain   Alternatives discussed:  No treatment Anesthesia (see MAR for exact dosages):    Anesthesia method:  Local infiltration   Local anesthetic:  Lidocaine 2% WITH epi Laceration details:    Location:  Face   Face location:  Forehead   Length (cm):  1.5 Repair type:    Repair type:  Simple Pre-procedure details:    Preparation:  Patient was prepped and draped in usual sterile fashion Exploration:    Hemostasis achieved with:  Direct pressure   Wound exploration: entire depth of wound probed and visualized     Contaminated: no   Treatment:    Area cleansed with:  Saline   Amount of cleaning:  Standard   Irrigation solution:  Sterile saline   Irrigation method:  Syringe   Visualized foreign bodies/material removed: no   Skin repair:    Repair method:  Sutures   Suture size:  6-0   Suture material:  Prolene   Suture technique:  Simple interrupted   Number of sutures:  3 Approximation:    Approximation:  Close   Vermilion border: well-aligned   Post-procedure details:    Dressing:  Antibiotic ointment and adhesive bandage   Patient tolerance of procedure:  Tolerated well, no immediate complications   (including critical care time)  Medications Ordered in ED Medications  lidocaine-EPINEPHrine (XYLOCAINE W/EPI) 2 %-1:100000 (with pres) injection 20 mL (20 mLs Infiltration Given by Other 07/31/16 0159)    ibuprofen (ADVIL,MOTRIN) tablet 800 mg (800 mg Oral Given 07/31/16 0159)     Initial Impression / Assessment and Plan / ED Course  I have reviewed the triage vital signs and the nursing notes.  Pertinent labs & imaging results that were available during my care of the patient were reviewed by me and considered in my medical decision making (see chart for details).     Pt presenting after head injury.  He did have LOC, GCS in the ED is 15.  Head CT obtained and negative.  forehad laceration repaired as noted above.  Scalp laceration cleaned and explored.  This does not appear to require sutures/staples- is not deep, wound edges are approximated.  Discharged with strict return precautions.  Pt agreeable with plan.pt has an upto date tetanus.    Final Clinical Impressions(s) / ED Diagnoses   Final diagnoses:  Injury of head, initial encounter  Laceration of scalp, initial encounter  Laceration of forehead, initial encounter    New Prescriptions Discharge Medication List  as of 07/31/2016  3:33 AM       Jerelyn Scott, MD 07/31/16 [phone]

## 2016-07-31 NOTE — ED Notes (Signed)
Pt verbalizes understanding of d/c instructions and denies any further needs at this time. 

## 2016-07-31 NOTE — ED Notes (Signed)
Patient transported to CT 

## 2016-08-01 ENCOUNTER — Encounter (HOSPITAL_COMMUNITY)
Admission: EM | Disposition: A | Discharge status: Home / Self Care | Payer: Self-pay | Source: Home / Self Care | Attending: Internal Medicine

## 2016-08-01 ENCOUNTER — Inpatient Hospital Stay (HOSPITAL_COMMUNITY)
Admission: EM | Admit: 2016-08-01 | Discharge: 2016-08-03 | DRG: 378 | Disposition: A | Discharge status: Home / Self Care | Payer: Medicaid Other | Attending: Internal Medicine | Admitting: Internal Medicine

## 2016-08-01 ENCOUNTER — Encounter (HOSPITAL_COMMUNITY): Payer: Self-pay

## 2016-08-01 DIAGNOSIS — S0990XD Unspecified injury of head, subsequent encounter: Secondary | ICD-10-CM

## 2016-08-01 DIAGNOSIS — T39395A Adverse effect of other nonsteroidal anti-inflammatory drugs [NSAID], initial encounter: Secondary | ICD-10-CM | POA: Diagnosis present

## 2016-08-01 DIAGNOSIS — W19XXXA Unspecified fall, initial encounter: Secondary | ICD-10-CM | POA: Diagnosis present

## 2016-08-01 DIAGNOSIS — I959 Hypotension, unspecified: Secondary | ICD-10-CM | POA: Diagnosis present

## 2016-08-01 DIAGNOSIS — K2980 Duodenitis without bleeding: Secondary | ICD-10-CM | POA: Diagnosis not present

## 2016-08-01 DIAGNOSIS — D62 Acute posthemorrhagic anemia: Secondary | ICD-10-CM | POA: Diagnosis present

## 2016-08-01 DIAGNOSIS — K269 Duodenal ulcer, unspecified as acute or chronic, without hemorrhage or perforation: Secondary | ICD-10-CM | POA: Diagnosis not present

## 2016-08-01 DIAGNOSIS — G8929 Other chronic pain: Secondary | ICD-10-CM | POA: Diagnosis present

## 2016-08-01 DIAGNOSIS — Z79899 Other long term (current) drug therapy: Secondary | ICD-10-CM

## 2016-08-01 DIAGNOSIS — R55 Syncope and collapse: Secondary | ICD-10-CM | POA: Diagnosis present

## 2016-08-01 DIAGNOSIS — R51 Headache: Secondary | ICD-10-CM | POA: Diagnosis present

## 2016-08-01 DIAGNOSIS — K920 Hematemesis: Secondary | ICD-10-CM

## 2016-08-01 DIAGNOSIS — K2970 Gastritis, unspecified, without bleeding: Secondary | ICD-10-CM | POA: Diagnosis present

## 2016-08-01 DIAGNOSIS — Y9367 Activity, basketball: Secondary | ICD-10-CM

## 2016-08-01 DIAGNOSIS — S0181XA Laceration without foreign body of other part of head, initial encounter: Secondary | ICD-10-CM | POA: Diagnosis present

## 2016-08-01 DIAGNOSIS — I10 Essential (primary) hypertension: Secondary | ICD-10-CM | POA: Diagnosis present

## 2016-08-01 DIAGNOSIS — R042 Hemoptysis: Secondary | ICD-10-CM | POA: Diagnosis present

## 2016-08-01 DIAGNOSIS — R519 Headache, unspecified: Secondary | ICD-10-CM | POA: Diagnosis present

## 2016-08-01 DIAGNOSIS — S0990XA Unspecified injury of head, initial encounter: Secondary | ICD-10-CM | POA: Insufficient documentation

## 2016-08-01 DIAGNOSIS — K922 Gastrointestinal hemorrhage, unspecified: Secondary | ICD-10-CM | POA: Diagnosis not present

## 2016-08-01 DIAGNOSIS — K264 Chronic or unspecified duodenal ulcer with hemorrhage: Principal | ICD-10-CM | POA: Diagnosis present

## 2016-08-01 HISTORY — PX: ESOPHAGOGASTRODUODENOSCOPY (EGD) WITH PROPOFOL: SHX5813

## 2016-08-01 LAB — MRSA PCR SCREENING: MRSA by PCR: NEGATIVE

## 2016-08-01 LAB — CBC
HEMATOCRIT: 37.6 % — AB (ref 39.0–52.0)
Hemoglobin: 13.2 g/dL (ref 13.0–17.0)
MCH: 30.3 pg (ref 26.0–34.0)
MCHC: 35.1 g/dL (ref 30.0–36.0)
MCV: 86.4 fL (ref 78.0–100.0)
PLATELETS: 306 10*3/uL (ref 150–400)
RBC: 4.35 MIL/uL (ref 4.22–5.81)
RDW: 12.4 % (ref 11.5–15.5)
WBC: 8.5 10*3/uL (ref 4.0–10.5)

## 2016-08-01 LAB — TYPE AND SCREEN
ABO/RH(D): O POS
Antibody Screen: NEGATIVE

## 2016-08-01 LAB — DIFFERENTIAL
Basophils Absolute: 0 10*3/uL (ref 0.0–0.1)
Basophils Relative: 0 %
Eosinophils Absolute: 0 10*3/uL (ref 0.0–0.7)
Eosinophils Relative: 0 %
LYMPHS ABS: 1.5 10*3/uL (ref 0.7–4.0)
Lymphocytes Relative: 18 %
MONOS PCT: 7 %
Monocytes Absolute: 0.6 10*3/uL (ref 0.1–1.0)
NEUTROS PCT: 75 %
Neutro Abs: 6.3 10*3/uL (ref 1.7–7.7)

## 2016-08-01 LAB — I-STAT CG4 LACTIC ACID, ED: Lactic Acid, Venous: 1.53 mmol/L (ref 0.5–1.9)

## 2016-08-01 LAB — COMPREHENSIVE METABOLIC PANEL
ALBUMIN: 3.5 g/dL (ref 3.5–5.0)
ALT: 21 U/L (ref 17–63)
AST: 32 U/L (ref 15–41)
Alkaline Phosphatase: 51 U/L (ref 38–126)
Anion gap: 10 (ref 5–15)
BUN: 34 mg/dL — AB (ref 6–20)
CHLORIDE: 104 mmol/L (ref 101–111)
CO2: 22 mmol/L (ref 22–32)
CREATININE: 0.75 mg/dL (ref 0.61–1.24)
Calcium: 8.5 mg/dL — ABNORMAL LOW (ref 8.9–10.3)
GFR calc Af Amer: >60 mL/min (ref >60)
GFR calc non Af Amer: >60 mL/min (ref >60)
Glucose, Bld: 145 mg/dL — ABNORMAL HIGH (ref 65–99)
POTASSIUM: 4.5 mmol/L (ref 3.5–5.1)
SODIUM: 136 mmol/L (ref 135–145)
Total Bilirubin: 1 mg/dL (ref 0.3–1.2)
Total Protein: 6.6 g/dL (ref 6.5–8.1)

## 2016-08-01 LAB — HEMOGLOBIN AND HEMATOCRIT, BLOOD
HCT: 30.1 % — ABNORMAL LOW (ref 39.0–52.0)
HEMATOCRIT: 27.7 % — AB (ref 39.0–52.0)
HEMOGLOBIN: 10.6 g/dL — AB (ref 13.0–17.0)
Hemoglobin: 9.7 g/dL — ABNORMAL LOW (ref 13.0–17.0)

## 2016-08-01 LAB — POC OCCULT BLOOD, ED: Fecal Occult Bld: POSITIVE — AB

## 2016-08-01 LAB — ABO/RH: ABO/RH(D): O POS

## 2016-08-01 LAB — GLUCOSE, CAPILLARY: Glucose-Capillary: 95 mg/dL (ref 65–99)

## 2016-08-01 SURGERY — ESOPHAGOGASTRODUODENOSCOPY (EGD) WITH PROPOFOL
Anesthesia: Moderate Sedation

## 2016-08-01 MED ORDER — BUTAMBEN-TETRACAINE-BENZOCAINE 2-2-14 % EX AERO
INHALATION_SPRAY | CUTANEOUS | Status: DC | PRN
  Administered 2016-08-01: 2 via TOPICAL

## 2016-08-01 MED ORDER — SODIUM CHLORIDE 0.9 % IV SOLN
8.0000 mg/h | INTRAVENOUS | Status: DC
  Administered 2016-08-01 – 2016-08-02 (×3): 8 mg/h via INTRAVENOUS
  Filled 2016-08-01 (×8): qty 80

## 2016-08-01 MED ORDER — SODIUM CHLORIDE 0.9% FLUSH
3.0000 mL | Freq: Two times a day (BID) | INTRAVENOUS | Status: DC
  Administered 2016-08-02 – 2016-08-03 (×2): 3 mL via INTRAVENOUS

## 2016-08-01 MED ORDER — ONDANSETRON HCL 4 MG/2ML IJ SOLN: 4.0000 mg | Freq: Four times a day (QID) | INTRAMUSCULAR | Status: DC | PRN

## 2016-08-01 MED ORDER — MIDAZOLAM HCL 10 MG/2ML IJ SOLN
INTRAMUSCULAR | Status: DC | PRN
  Administered 2016-08-01 (×2): 2 mg via INTRAVENOUS
  Administered 2016-08-01: 1 mg via INTRAVENOUS

## 2016-08-01 MED ORDER — PANTOPRAZOLE SODIUM 40 MG IV SOLR: 40.0000 mg | Freq: Two times a day (BID) | INTRAVENOUS | Status: DC

## 2016-08-01 MED ORDER — SODIUM CHLORIDE 0.9 % IV SOLN: INTRAVENOUS | Status: DC

## 2016-08-01 MED ORDER — MIDAZOLAM HCL 5 MG/ML IJ SOLN
INTRAMUSCULAR | Status: AC
  Filled 2016-08-01: qty 3

## 2016-08-01 MED ORDER — MORPHINE SULFATE (PF) 2 MG/ML IV SOLN
2.0000 mg | INTRAVENOUS | Status: DC | PRN
  Administered 2016-08-02: 3 mg via INTRAVENOUS
  Administered 2016-08-02: 4 mg via INTRAVENOUS
  Administered 2016-08-02 – 2016-08-03 (×3): 2 mg via INTRAVENOUS
  Filled 2016-08-01: qty 1
  Filled 2016-08-01: qty 2
  Filled 2016-08-01 (×2): qty 1
  Filled 2016-08-01: qty 2

## 2016-08-01 MED ORDER — DIPHENHYDRAMINE HCL 50 MG/ML IJ SOLN
INTRAMUSCULAR | Status: AC
  Filled 2016-08-01: qty 1

## 2016-08-01 MED ORDER — DIPHENHYDRAMINE HCL 50 MG/ML IJ SOLN
INTRAMUSCULAR | Status: DC | PRN
  Administered 2016-08-01 (×2): 25 mg via INTRAVENOUS

## 2016-08-01 MED ORDER — EPINEPHRINE PF 1 MG/10ML IJ SOSY
PREFILLED_SYRINGE | INTRAMUSCULAR | Status: AC
  Filled 2016-08-01: qty 10

## 2016-08-01 MED ORDER — FENTANYL CITRATE (PF) 100 MCG/2ML IJ SOLN
INTRAMUSCULAR | Status: AC
  Filled 2016-08-01: qty 4

## 2016-08-01 MED ORDER — SODIUM CHLORIDE 0.9 % IV SOLN
INTRAVENOUS | Status: DC
  Administered 2016-08-01: 07:00:00 via INTRAVENOUS

## 2016-08-01 MED ORDER — ONDANSETRON HCL 4 MG PO TABS: 4.0000 mg | ORAL_TABLET | Freq: Four times a day (QID) | ORAL | Status: DC | PRN

## 2016-08-01 MED ORDER — PANTOPRAZOLE SODIUM 40 MG IV SOLR
INTRAVENOUS | Status: DC
Start: 2016-08-01 — End: 2016-08-01
  Filled 2016-08-01: qty 40

## 2016-08-01 MED ORDER — MORPHINE SULFATE (PF) 2 MG/ML IV SOLN
4.0000 mg | Freq: Once | INTRAVENOUS | Status: AC
  Administered 2016-08-01: 4 mg via INTRAVENOUS
  Filled 2016-08-01: qty 2

## 2016-08-01 MED ORDER — PANTOPRAZOLE SODIUM 40 MG IV SOLR
80.0000 mg | Freq: Once | INTRAVENOUS | Status: AC
  Administered 2016-08-01: 80 mg via INTRAVENOUS
  Filled 2016-08-01: qty 80

## 2016-08-01 MED ORDER — SODIUM CHLORIDE 0.9 % IV BOLUS (SEPSIS)
1000.0000 mL | Freq: Once | INTRAVENOUS | Status: AC
  Administered 2016-08-01: 1000 mL via INTRAVENOUS

## 2016-08-01 MED ORDER — FENTANYL CITRATE (PF) 100 MCG/2ML IJ SOLN
INTRAMUSCULAR | Status: DC | PRN
  Administered 2016-08-01 (×2): 25 ug via INTRAVENOUS

## 2016-08-01 MED ORDER — DEXTROSE-NACL 5-0.9 % IV SOLN
INTRAVENOUS | Status: DC
  Administered 2016-08-01 – 2016-08-02 (×2): via INTRAVENOUS

## 2016-08-01 MED ORDER — ACETAMINOPHEN 650 MG RE SUPP: 650.0000 mg | Freq: Four times a day (QID) | RECTAL | Status: DC | PRN

## 2016-08-01 MED ORDER — ACETAMINOPHEN 325 MG PO TABS
650.0000 mg | ORAL_TABLET | Freq: Four times a day (QID) | ORAL | Status: DC | PRN
  Administered 2016-08-03: 650 mg via ORAL
  Filled 2016-08-01: qty 2

## 2016-08-01 SURGICAL SUPPLY — 14 items

## 2016-08-01 NOTE — ED Provider Notes (Signed)
[facility]-EMERGENCY DEPT Provider Note   CSN: [id number] Arrival date & time: 08/01/16  0350     History   Chief Complaint Chief Complaint  Patient presents with  . Rectal Bleeding  . Hemoptysis    HPI Joel Bowlbrahim A Compton is a 26 y.o. male.  The history is provided by the patient.  Rectal Bleeding  He noted onset this evening of bright and dark red blood per rectum. He states he has had about 8 bloody bowel movements. He also had one episode of vomiting blood which was mature bright and dark red blood. He has had some epigastric pain without radiation. He is feeling dizzy and lightheaded and generally weak. He does admit to using ibuprofen daily. He will occasionally smoke tobacco through a Hookah pipe. He also occasionally take Excedrin for headaches. He had been seen in the ED at [facility] yesterday for a head injury while playing basketball. He did get a dose of ibuprofen while in the ED. He denies prior history of leaving or ulcers.  History reviewed. No pertinent past medical history.  There are no active problems to display for this patient.   History reviewed. No pertinent surgical history.     Home Medications    Prior to Admission medications   Medication Sig Start Date End Date Taking? Authorizing Provider  cyclobenzaprine (FLEXERIL) 10 MG tablet Take 1 tablet (10 mg total) by mouth 3 (three) times daily as needed for muscle spasms. 07/30/14   Azalia Bilisampos, Kevin, MD  ibuprofen (ADVIL,MOTRIN) 600 MG tablet Take 1 tablet (600 mg total) by mouth every 8 (eight) hours as needed. 07/30/14   Azalia Bilisampos, Kevin, MD  loratadine (CLARITIN) 10 MG tablet Take 1 tablet (10 mg total) by mouth daily. 06/16/13   Marlon PelGreene, Tiffany, PA-C  Multiple Vitamins-Minerals (MULTIVITAMIN ADULT PO) Take 1 tablet by mouth daily.    [provider]    Family History History reviewed. No pertinent family history.  Social History Social History  Substance Use Topics  . Smoking status: Never  Smoker  . Smokeless tobacco: Not on file  . Alcohol use Yes     Comment: occa     Allergies   Patient has no known allergies.   Review of Systems Review of Systems  Gastrointestinal: Positive for hematochezia.  All other systems reviewed and are negative.    Physical Exam Updated Vital Signs BP 93/65 (BP Location: Left Arm)   Pulse (!) 140   Temp 98.2 F (36.8 C) (Oral)   Resp 16   SpO2 99%   Physical Exam  Nursing note and vitals reviewed.  26 year old male, resting comfortably and in no acute distress. Vital signs are significant for tachycardia and hypotension. Oxygen saturation is 99%, which is normal. Head is normocephalic and atraumatic. PERRLA, EOMI. Oropharynx is clear. Neck is nontender and supple without adenopathy or JVD. Back is nontender and there is no CVA tenderness. Lungs are clear without rales, wheezes, or rhonchi. Chest is nontender. Heart his tachycardic without murmur. Abdomen is soft, flat, with mild epigastric tenderness. There is no rebound or guarding. There are no masses or hepatosplenomegaly and peristalsis is hypoactive. Extremities have no cyanosis or edema, full range of motion is present. Skin is warm and dry without rash. Neurologic: Mental status is normal, cranial nerves are intact, there are no motor or sensory deficits.  ED Treatments / Results  Labs (all labs ordered are listed, but only abnormal results are displayed) Labs Reviewed  COMPREHENSIVE METABOLIC  PANEL  CBC  DIFFERENTIAL  POC OCCULT BLOOD, ED  I-STAT CG4 LACTIC ACID, ED  TYPE AND SCREEN    EKG  EKG Interpretation None       Radiology Ct Head Wo Contrast  Result Date: 07/31/2016 CLINICAL DATA:  Injury playing basketball tonight. Fall striking head on goal, positive loss of consciousness. Left frontal laceration and hematoma. EXAM: CT HEAD WITHOUT CONTRAST TECHNIQUE: Contiguous axial images were obtained from the base of the skull through the vertex without  intravenous contrast. COMPARISON:  None. FINDINGS: Brain: No evidence of acute infarction, hemorrhage, hydrocephalus, extra-axial collection or mass lesion/mass effect. Vascular: No hyperdense vessel or unexpected calcification. Skull: Left frontal scalp laceration and hematoma. No skull fracture. Sinuses/Orbits: Paranasal sinuses and mastoid air cells are clear. The visualized orbits are unremarkable. Other: None. IMPRESSION: Left frontal scalp hematoma and laceration. No skull fracture or acute intracranial abnormality. Electronically Signed   By: Rubye Oaks M.D.   On: 07/31/2016 01:32    Procedures Procedures (including critical care time) CRITICAL CARE Performed by: GNFAO,ZHYQM Total critical care time: 35 minutes Critical care time was exclusive of separately billable procedures and treating other patients. Critical care was necessary to treat or prevent imminent or life-threatening deterioration. Critical care was time spent personally by me on the following activities: development of treatment plan with patient and/or surrogate as well as nursing, discussions with consultants, evaluation of patient's response to treatment, examination of patient, obtaining history from patient or surrogate, ordering and performing treatments and interventions, ordering and review of laboratory studies, ordering and review of radiographic studies, pulse oximetry and re-evaluation of patient's condition.  Medications Ordered in ED Medications  sodium chloride 0.9 % bolus 1,000 mL (not administered)     Initial Impression / Assessment and Plan / ED Course  I have reviewed the triage vital signs and the nursing notes.  Pertinent labs & imaging results that were available during my care of the patient were reviewed by me and considered in my medical decision making (see chart for details).  GI bleed which is likely bleeding ulcer aggravated by frequent NSAID use. Old records are reviewed, and he was  seen at [facility] yesterday at which time heart rate and blood pressure were normal. He had another bloody bowel movement while in the ED. He is started on IV fluids and pantoprazole.  Following IV fluids, blood pressure has come up in heart rate has come down. Laboratory workup shows 3 g drop in hemoglobin compared with 2 years ago. BUN is significantly elevated consistent with upper GI bleed. Lactic acid level is normal. He does appear to be hemodynamically stable at this point. Case is discussed with Dr. Antionette Char of [facility] who agrees to admit the patient.  Final Clinical Impressions(s) / ED Diagnoses   Final diagnoses:  Gastrointestinal hemorrhage with hematemesis    New Prescriptions New Prescriptions   No medications on file     Dione Booze, MD 08/01/16 [phone]

## 2016-08-01 NOTE — Progress Notes (Signed)
PROGRESS NOTE    Joel Compton  WUJ:[id number] DOB: 12-Feb-1991 DOA: 08/01/2016 PCP: Default, Provider, MD    Brief Narrative:  26 yo male presented with hematochezia and hematemesis. Patient known to have chronic headaches. Patient had head trauma 24 hours prior to admission, associated with brief loss of consciousness and forehead laceration. The admission he had lightheadedness and 8 episodes of hematochezia and one episode of hematemesis. Chronic use of NSAIDs. On initial physical examination he was tachycardic 140 bpm, blood pressure 93/65. Mucous membranes were moist, his lungs were clear to auscultation, heart S1-S2 present rhythmic and tachycardic, abdomen had mild tenderness in the epigastrium, no rebound or guarding, no lower extremity edema. Sodium 136, potassium 4.5, chloride 104, bicarbonate 22, glucose 145, BUN 34, creatinine 0.75, white count 8.5, hemoglobin 13.2, hematocrit 37.6, platelets 306. Positive fecal occult blood.   Patient was admitted to the hospital working diagnosis of upper GI bleed, complicated by hypotension and acute blood loss anemia.    Assessment & Plan:   Principal Problem:   Acute upper GI bleed Active Problems:   Forehead laceration   Chronic headaches   1. Acute upper GI bleed. Patient with NSAIDs use, will continue pantoprazole IV, keep NPO, H&H follow up, will follow on gastroenterology, patient may need diagnostic endoscopy.   2. Acute blood loss anemia. Presumed acute blood loss, compared to 2016 has drop 3 grams on hb from 16 to 13, will continue to monitor Hb and Hct. IV fluids with isotonic saline at 75 cc/hr. Hold blood transfusion for now, unless recurrent severe bleeding or hb below 7.   3. Head trauma. Patient alert, no confusion or agitation, follow on neuro checks per unit protocol.   4. Chronic headaches. Will avoid non steroidal anti-inflammatory agents, for now. Pain controlled, with tylenol.    DVT prophylaxis: scd Code Status:  Full  Family Communication:  Disposition Plan: Home   Consultants:   Gastroenterology   Procedures:    Antimicrobials:    Subjective: Patient had to episodes of hematochezia, no further hematemesis, no nausea or vomiting, no abdominal pain, no dyspnea or chest pain.   Objective: Vitals:   08/01/16 0600 08/01/16 0609 08/01/16 0645 08/01/16 0700  BP: 124/78  122/73   Pulse: 88 85 89 88  Resp: 14  (!) 22 16  Temp:   98.7 F (37.1 C)   TempSrc:   Oral   SpO2: 97%  98% 98%  Weight:   81.8 kg (180 lb 5.4 oz)   Height:   5\' 11"  (1.803 m)     Intake/Output Summary (Last 24 hours) at 08/01/16 0759 Last data filed at 08/01/16 0700  Gross per 24 hour  Intake          1160.42 ml  Output                0 ml  Net          1160.42 ml   Filed Weights   08/01/16 0645  Weight: 81.8 kg (180 lb 5.4 oz)    Examination:  General exam: Not in pain or dyspnea E ENT: no pallor or icterus, oral mucosa moist.  Respiratory system: Clear to auscultation. Respiratory effort normal. No wheezing, rales or rhonchi.  Cardiovascular system: S1 & S2 heard, RRR. No JVD, murmurs, rubs, gallops or clicks. No pedal edema. Gastrointestinal system: Abdomen is nondistended, soft and nontender. No organomegaly or masses felt. Normal bowel sounds heard. Central nervous system: Alert and oriented. No focal neurological  deficits. Extremities: Symmetric 5 x 5 power. Skin: No rashes, lesions or ulcers     Data Reviewed: I have personally reviewed following labs and imaging studies  CBC:  Recent Labs Lab 08/01/16 0425  WBC 8.5  NEUTROABS 6.3  HGB 13.2  HCT 37.6*  MCV 86.4  PLT 306   Basic Metabolic Panel:  Recent Labs Lab 08/01/16 0425  NA 136  K 4.5  CL 104  CO2 22  GLUCOSE 145*  BUN 34*  CREATININE 0.75  CALCIUM 8.5*   GFR: Estimated Creatinine Clearance: 151.6 mL/min (by C-G formula based on SCr of 0.75 mg/dL). Liver Function Tests:  Recent Labs Lab 08/01/16 0425  AST 32    ALT 21  ALKPHOS 51  BILITOT 1.0  PROT 6.6  ALBUMIN 3.5   No results for input(s): LIPASE, AMYLASE in the last 168 hours. No results for input(s): AMMONIA in the last 168 hours. Coagulation Profile: No results for input(s): INR, PROTIME in the last 168 hours. Cardiac Enzymes: No results for input(s): CKTOTAL, CKMB, CKMBINDEX, TROPONINI in the last 168 hours. BNP (last 3 results) No results for input(s): PROBNP in the last 8760 hours. HbA1C: No results for input(s): HGBA1C in the last 72 hours. CBG: No results for input(s): GLUCAP in the last 168 hours. Lipid Profile: No results for input(s): CHOL, HDL, LDLCALC, TRIG, CHOLHDL, LDLDIRECT in the last 72 hours. Thyroid Function Tests: No results for input(s): TSH, T4TOTAL, FREET4, T3FREE, THYROIDAB in the last 72 hours. Anemia Panel: No results for input(s): VITAMINB12, FOLATE, FERRITIN, TIBC, IRON, RETICCTPCT in the last 72 hours. Sepsis Labs:  Recent Labs Lab 08/01/16 0431  LATICACIDVEN 1.53    No results found for this or any previous visit (from the past 240 hour(s)).       Radiology Studies: Ct Head Wo Contrast  Result Date: 07/31/2016 CLINICAL DATA:  Injury playing basketball tonight. Fall striking head on goal, positive loss of consciousness. Left frontal laceration and hematoma. EXAM: CT HEAD WITHOUT CONTRAST TECHNIQUE: Contiguous axial images were obtained from the base of the skull through the vertex without intravenous contrast. COMPARISON:  None. FINDINGS: Brain: No evidence of acute infarction, hemorrhage, hydrocephalus, extra-axial collection or mass lesion/mass effect. Vascular: No hyperdense vessel or unexpected calcification. Skull: Left frontal scalp laceration and hematoma. No skull fracture. Sinuses/Orbits: Paranasal sinuses and mastoid air cells are clear. The visualized orbits are unremarkable. Other: None. IMPRESSION: Left frontal scalp hematoma and laceration. No skull fracture or acute intracranial  abnormality. Electronically Signed   By: Rubye Oaks M.D.   On: 07/31/2016 01:32        Scheduled Meds: . [START ON 08/04/2016] pantoprazole  40 mg Intravenous Q12H  . sodium chloride flush  3 mL Intravenous Q12H   Continuous Infusions: . sodium chloride 125 mL/hr at 08/01/16 0631  . pantoprozole (PROTONIX) infusion 8 mg/hr (08/01/16 0522)     LOS: 0 days       Lashai Grosch Annett Gula, MD [facility] Pager [phone]  If 7PM-7AM, please contact night-coverage [email or web address] Password [email or web address] 08/01/2016, 7:59 AM

## 2016-08-01 NOTE — Brief Op Note (Signed)
08/01/2016  3:23 PM  PATIENT:  Joel Compton  26 y.o. male  PRE-OPERATIVE DIAGNOSIS:  GI bleed  POST-OPERATIVE DIAGNOSIS:  Gastritis, duodenitis, small clean-based duodenal bulb ulcer  PROCEDURE:  Procedure(s): ESOPHAGOGASTRODUODENOSCOPY (EGD) WITH PROPOFOL (N/A)  SURGEON:  Surgeon(s) and Role:    * Gerron Guidotti, MD - Primary  Findings/ recommendations ------------------------------------- - very difficult procedure because of ongoing patient discomfort and agitation. - No evidence of active bleeding. - Small clean-based duodenal bulb ulcer as well as duodenitis noted. Biopsy was not performed. - If continues to have rectal bleeding, consider colonoscopy for further evaluation - Monitor H&H. Continue IV PPI for now. - Okay to have clear liquid diet. - GI will follow    Kathi DerParag Cynthea Zachman MD, FACP 08/01/2016, 3:25 PM  Pager [phone]  If no answer or after 5 PM call [phone]

## 2016-08-01 NOTE — ED Triage Notes (Signed)
Pt presents w/ c/o bloody stools and hemoptysis. Pt denies abd pain.  Pt A+OX4, speaking in complete sentences, ambulatory triage.

## 2016-08-01 NOTE — Consult Note (Signed)
Referring Provider:  Dr Antionette Char Primary Care Physician:  Default, Provider, MD Primary Gastroenterologist:  Gentry Fitz  Reason for Consultation:  GI bleed  HPI: Joel Compton is a 26 y.o. male admitted to the hospital for further evaluation of rectal bleeding and hematemesis. Patient was having on and off epigastric pain since last 2 days. Patient woke up around 1 AM this morning with desire to go for bowel movement and noticed dark wine-colored stool with surrounding pink blood. Patient had multiple bowel movements. Patient subsequently had nausea followed by vomiting which contained bright blood. Patient also complaining of dizziness as well as weakness. No similar symptoms in the past. Patient takes daily ibuprofen 2-3 times a day for headache with occasional use of Excedrin.  Patient was found to be hypotensive and tachycardic while in the ER. Become hemodynamically stable after one liter of IV fluids.   Had 2 bowel movements while on floor which were maroon color according to nursing staff.  History reviewed. No pertinent past medical history.  History reviewed. No pertinent surgical history.  Prior to Admission medications   Medication Sig Start Date End Date Taking? Authorizing Provider  aspirin-acetaminophen-caffeine (EXCEDRIN MIGRAINE) [phone] MG tablet Take 2 tablets by mouth every 6 (six) hours as needed for headache.   Yes [provider]  ibuprofen (ADVIL,MOTRIN) 200 MG tablet Take 200-600 mg by mouth every 6 (six) hours as needed for headache, mild pain or moderate pain.   Yes [provider]    Scheduled Meds: . [START ON 08/04/2016] pantoprazole  40 mg Intravenous Q12H  . sodium chloride flush  3 mL Intravenous Q12H   Continuous Infusions: . sodium chloride 75 mL/hr at 08/01/16 0907  . pantoprozole (PROTONIX) infusion 8 mg/hr (08/01/16 0522)   PRN Meds:.acetaminophen **OR** acetaminophen, morphine injection, ondansetron **OR** ondansetron (ZOFRAN)  IV  Allergies as of 08/01/2016  . (No Known Allergies)    History reviewed. No pertinent family history.  Social History   Social History  . Marital status: Single    Spouse name: N/A  . Number of children: N/A  . Years of education: N/A   Occupational History  . Not on file.   Social History Main Topics  . Smoking status: Light Tobacco Smoker    Types: Pipe  . Smokeless tobacco: Never Used     Comment: uses hookah socially   . Alcohol use Yes     Comment: occa  . Drug use: No  . Sexual activity: Not on file   Other Topics Concern  . Not on file   Social History Narrative  . No narrative on file    Review of Systems:  Review of Systems  Constitutional: Positive for malaise/fatigue. Negative for chills and fever.  HENT: Negative for ear discharge, ear pain, hearing loss and tinnitus.   Eyes: Negative for blurred vision, double vision, photophobia and pain.  Respiratory: Negative for cough and hemoptysis.   Cardiovascular: Negative for chest pain and palpitations.  Gastrointestinal: Positive for abdominal pain, blood in stool, heartburn, melena, nausea and vomiting.  Genitourinary: Negative for dysuria and urgency.  Musculoskeletal: Negative for myalgias and neck pain.  Skin: Negative for rash.  Neurological: Positive for dizziness, weakness and headaches.  Endo/Heme/Allergies: Does not bruise/bleed easily.  Psychiatric/Behavioral: Negative for hallucinations and suicidal ideas.    Physical Exam: Vital signs: Vitals:   08/01/16 0800 08/01/16 0900  BP:  (!) 108/43  Pulse: 88 85  Resp: (!) 21 11  Temp:  Physical Exam  Constitutional: He is oriented to person, place, and time. He appears well-developed and well-nourished. No distress.  HENT:  Head: Normocephalic and atraumatic.  Nose: Nose normal.  Mouth/Throat: Oropharynx is clear and moist. No oropharyngeal exudate.  Eyes: EOM are normal. Pupils are equal, round, and reactive to light. No scleral  icterus.  Neck: Normal range of motion. Neck supple. No thyromegaly present.  Cardiovascular: Normal rate, regular rhythm and normal heart sounds.   Pulmonary/Chest: Effort normal and breath sounds normal. No respiratory distress.  Abdominal: Soft. Bowel sounds are normal. He exhibits no distension. There is tenderness. There is no rebound and no guarding.  Epigastric tenderness to palpation. Patient also has bilateral lower quadrant discomfort on palpation  Musculoskeletal: Normal range of motion. He exhibits no edema.  Neurological: He is alert and oriented to person, place, and time.  Skin: Skin is warm. No erythema.  Psychiatric: He has a normal mood and affect. His behavior is normal. Judgment and thought content normal.    GI:  Lab Results:  Recent Labs  08/01/16 0425 08/01/16 1012  WBC 8.5  --   HGB 13.2 10.6*  HCT 37.6* 30.1*  PLT 306  --    BMET  Recent Labs  08/01/16 0425  NA 136  K 4.5  CL 104  CO2 22  GLUCOSE 145*  BUN 34*  CREATININE 0.75  CALCIUM 8.5*   LFT  Recent Labs  08/01/16 0425  PROT 6.6  ALBUMIN 3.5  AST 32  ALT 21  ALKPHOS 51  BILITOT 1.0   PT/INR No results for input(s): LABPROT, INR in the last 72 hours.   Studies/Results: Ct Head Wo Contrast  Result Date: 07/31/2016 CLINICAL DATA:  Injury playing basketball tonight. Fall striking head on goal, positive loss of consciousness. Left frontal laceration and hematoma. EXAM: CT HEAD WITHOUT CONTRAST TECHNIQUE: Contiguous axial images were obtained from the base of the skull through the vertex without intravenous contrast. COMPARISON:  None. FINDINGS: Brain: No evidence of acute infarction, hemorrhage, hydrocephalus, extra-axial collection or mass lesion/mass effect. Vascular: No hyperdense vessel or unexpected calcification. Skull: Left frontal scalp laceration and hematoma. No skull fracture. Sinuses/Orbits: Paranasal sinuses and mastoid air cells are clear. The visualized orbits are  unremarkable. Other: None. IMPRESSION: Left frontal scalp hematoma and laceration. No skull fracture or acute intracranial abnormality. Electronically Signed   By: Rubye OaksMelanie  Ehinger M.D.   On: 07/31/2016 01:32    Impression/Plan: - Hematemesis with hematochezia. elevated BUN. Patient with significant NSAID use. Most likely upper GI bleed. - Tachycardia and hypertension. Resolved - Acute blood loss anemia.  Recommendations ------------------------ - Continue Protonix drip. EGD for further evaluation. Risk benefits alternatives discussed with the patient. Verbalized understanding. - Patient was advised to avoid NSAIDs. - If negative EGD, he may require colonoscopy for further evaluation. Monitor H&H - Keep nothing by mouth for now. GI will follow    LOS: 0 days   Kathi DerParag Taquita Demby  MD, FACP 08/01/2016, 10:48 AM  Pager [phone] If no answer or after 5 PM call [phone]

## 2016-08-01 NOTE — H&P (Signed)
History and Physical    Joel Compton ZOX:[id number] DOB: 10-16-1990 DOA: 08/01/2016  PCP: Default, Provider, MD   Patient coming from: Home  Chief Complaint: Hematochezia, hematemesis   HPI: Joel Compton is a 26 y.o. male with medical history significant for chronic daily headaches and head injury with brief loss of consciousness yesterday, now presenting to the emergency department with several hours of hematochezia, hematemesis, and lightheadedness. Patient hit his head yesterday will play basketball, suffering a brief loss of consciousness and a forehead laceration that was repaired in the emergency department after head CT was negative for acute intracranial abnormality. Patient was discharged home and continued to do well back at home until approximately 1 AM when he experienced an urgent need to move his bowels and describes having a large maroon stool. He became a little bit lightheaded after this and soon returned to the bathroom with more hematochezia. Patient reports approximately 8 episodes of this and one episode of vomiting with bright red blood. He notes mild discomfort in the upper abdomen, but denies pain. He has never experienced similar symptoms previously. Denies history of GI malignancy in his family. Patient denies any alcohol use, siting religious reasons. He uses Advil near daily for headaches, reporting use of 400 mg most mornings and another dose later most days. He also uses Excedrin Migraine which contains aspirin and took 2 of those last night in addition to abdomen.   ED Course: Upon arrival to the ED, patient is found to be afebrile, saturating adequately on room air, tachycardic to 140, and with blood pressure 93/65. Chemistry panels notable for a BUN of 34 and creatinine of 0.75. CBC features a hemoglobin of 13.2 and lactic acid is within the normal limits at 1.53. Patient was given a liter of normal saline, 80 mg IV push of Protonix, and started on Protonix infusion in  the ED. Tachycardia resolved and blood pressure improved after the fluid bolus. Patient had a large bloody stool while in the emergency department. He remained hemodynamically stable after the fluid bolus and has not been in respiratory distress. He will be admitted to the stepdown unit for ongoing evaluation and management of acute upper GI bleed, likely related to his NSAID use.  Review of Systems:  All other systems reviewed and apart from HPI, are negative.  History reviewed. No pertinent past medical history.  History reviewed. No pertinent surgical history.   reports that he has been smoking Pipe.  He has never used smokeless tobacco. He reports that he drinks alcohol. He reports that he does not use drugs.  No Known Allergies  History reviewed. No pertinent family history.   Prior to Admission medications   Medication Sig Start Date End Date Taking? Authorizing Provider  aspirin-acetaminophen-caffeine (EXCEDRIN MIGRAINE) [phone] MG tablet Take 2 tablets by mouth every 6 (six) hours as needed for headache.   Yes [provider]  ibuprofen (ADVIL,MOTRIN) 200 MG tablet Take 200-600 mg by mouth every 6 (six) hours as needed for headache, mild pain or moderate pain.   Yes [provider]    Physical Exam: Vitals:   08/01/16 0357 08/01/16 0530 08/01/16 0609  BP: 93/65 121/75   Pulse: (!) 140 84 85  Resp: 16 20   Temp: 98.2 F (36.8 C)    TempSrc: Oral    SpO2: 99% 99%       Constitutional: NAD, calm, anxious Eyes: PERTLA, lids and conjunctivae normal ENMT: Mucous membranes are moist. Posterior pharynx clear of  any exudate or lesions.   Neck: normal, supple, no masses, no thyromegaly Respiratory: clear to auscultation bilaterally, no wheezing, no crackles. Normal respiratory effort.    Cardiovascular: S1 & S2 heard, regular rate and rhythm, hyperdynamic precordium. No extremity edema. No significant JVD. Abdomen: No distension, mild tenderness in  epigastrium, no rebound pain or guarding. Bowel sounds active.  Musculoskeletal: no clubbing / cyanosis. No joint deformity upper and lower extremities.   Skin: no significant rashes, lesions, ulcers. Warm, dry, well-perfused. Neurologic: CN 2-12 grossly intact. Sensation intact, DTR normal. Strength 5/5 in all 4 limbs.  Psychiatric: Alert and oriented x 3. Pleasant and cooperative.     Labs on Admission: I have personally reviewed following labs and imaging studies  CBC:  Recent Labs Lab 08/01/16 0425  WBC 8.5  NEUTROABS 6.3  HGB 13.2  HCT 37.6*  MCV 86.4  PLT 306   Basic Metabolic Panel:  Recent Labs Lab 08/01/16 0425  NA 136  K 4.5  CL 104  CO2 22  GLUCOSE 145*  BUN 34*  CREATININE 0.75  CALCIUM 8.5*   GFR: Estimated Creatinine Clearance: 151.6 mL/min (by C-G formula based on SCr of 0.75 mg/dL). Liver Function Tests:  Recent Labs Lab 08/01/16 0425  AST 32  ALT 21  ALKPHOS 51  BILITOT 1.0  PROT 6.6  ALBUMIN 3.5   No results for input(s): LIPASE, AMYLASE in the last 168 hours. No results for input(s): AMMONIA in the last 168 hours. Coagulation Profile: No results for input(s): INR, PROTIME in the last 168 hours. Cardiac Enzymes: No results for input(s): CKTOTAL, CKMB, CKMBINDEX, TROPONINI in the last 168 hours. BNP (last 3 results) No results for input(s): PROBNP in the last 8760 hours. HbA1C: No results for input(s): HGBA1C in the last 72 hours. CBG: No results for input(s): GLUCAP in the last 168 hours. Lipid Profile: No results for input(s): CHOL, HDL, LDLCALC, TRIG, CHOLHDL, LDLDIRECT in the last 72 hours. Thyroid Function Tests: No results for input(s): TSH, T4TOTAL, FREET4, T3FREE, THYROIDAB in the last 72 hours. Anemia Panel: No results for input(s): VITAMINB12, FOLATE, FERRITIN, TIBC, IRON, RETICCTPCT in the last 72 hours. Urine analysis:    Component Value Date/Time   COLORURINE YELLOW 11/07/2012 1739   APPEARANCEUR CLEAR 11/07/2012  1739   LABSPEC 1.020 11/07/2012 1739   PHURINE 7.5 11/07/2012 1739   GLUCOSEU NEGATIVE 11/07/2012 1739   HGBUR NEGATIVE 11/07/2012 1739   BILIRUBINUR NEGATIVE 11/07/2012 1739   KETONESUR NEGATIVE 11/07/2012 1739   PROTEINUR NEGATIVE 11/07/2012 1739   UROBILINOGEN 1.0 11/07/2012 1739   NITRITE NEGATIVE 11/07/2012 1739   LEUKOCYTESUR SMALL (A) 11/07/2012 1739   Sepsis Labs: @LABRCNTIP (procalcitonin:4,lacticidven:4) )No results found for this or any previous visit (from the past 240 hour(s)).   Radiological Exams on Admission: Ct Head Wo Contrast  Result Date: 07/31/2016 CLINICAL DATA:  Injury playing basketball tonight. Fall striking head on goal, positive loss of consciousness. Left frontal laceration and hematoma. EXAM: CT HEAD WITHOUT CONTRAST TECHNIQUE: Contiguous axial images were obtained from the base of the skull through the vertex without intravenous contrast. COMPARISON:  None. FINDINGS: Brain: No evidence of acute infarction, hemorrhage, hydrocephalus, extra-axial collection or mass lesion/mass effect. Vascular: No hyperdense vessel or unexpected calcification. Skull: Left frontal scalp laceration and hematoma. No skull fracture. Sinuses/Orbits: Paranasal sinuses and mastoid air cells are clear. The visualized orbits are unremarkable. Other: None. IMPRESSION: Left frontal scalp hematoma and laceration. No skull fracture or acute intracranial abnormality. Electronically Signed   By: Shawna Orleans  Ehinger M.D.   On: 07/31/2016 01:32    EKG: Not performed.   Assessment/Plan  1. Acute upper GI-bleed  - Pt presents with several episodes of hematochezia, hematemesis x1, and lightheadedness  - He is found to be tachycardic to 140 with initial BP 93/65 (SBP had been in 130's during recent ED visit)  - Initial Hgb 13.2  - He had a large maroon stool in ED; tachycardia resolved and BP improved with 1 liter NS bolus  - He was given 80 mg IVP Protonix in ED and started on Protonix infusion  -  Suspect this is secondary to his daily Advil and aspirin (Excederin Migraine) use  - Plan to admit to SDU, continue IVF hydration, continue PPI infusion, keep NPO, trend H&H q8h  - [facility] will see patient in consultation; their involvement much appreciated  2. Chronic headaches  - Pt reports chronic headaches, almost daily  - Reports this to be longstanding and stable  - Head CT yesterday without acute intracranial abnormalities  - He uses Advil and Excedrin Migraine, but in light of suspected UGIB, may need to change therapy or add acid-reducer  3. Head injury  - Pt hit his head on 07/31/16 while playing basketball and suffered brief LOC  - There are no focal neurologic deficits, head CT was negative for acute intracranial abnormality, and laceration was repaired in ED     DVT prophylaxis: SCD's  Code Status: Full  Family Communication: Discussed with patient Disposition Plan: Admit to SDU Consults called: None Admission status: Inpatient    Briscoe Deutscher, MD [facility] Pager [phone]  If 7PM-7AM, please contact night-coverage [email or web address] Password [facility]  08/01/2016, 6:26 AM

## 2016-08-01 NOTE — Op Note (Signed)
[facility] Patient Name: Joel Compton Procedure Date: 08/01/2016 MRN: [id number] Attending MD: Kathi DerParag Ladislav Caselli , MD Date of Birth: 12/25/1990 CSN: [id number] Age: 26 Admit Type: Inpatient Procedure:                Upper GI endoscopy Indications:              Hematemesis Providers:                Kathi DerParag Brandley Aldrete, MD, Tomma RakersJennifer Kappus, RN, Carin HockSharon                            Ricketts, RN, Janie Billups, Technician, Margo AyeValeria                            McKoy, Technician Referring MD:              Medicines:                Fentanyl 50 micrograms IV, Midazolam 5 mg IV,                            Diphenhydramine 50 mg IV Complications:            No immediate complications. Estimated Blood Loss:     Estimated blood loss: none. Procedure:                Pre-Anesthesia Assessment:                           - Prior to the procedure, a History and Physical                            was performed, and patient medications and                            allergies were reviewed. The patient's tolerance of                            previous anesthesia was also reviewed. The risks                            and benefits of the procedure and the sedation                            options and risks were discussed with the patient.                            All questions were answered, and informed consent                            was obtained. Prior Anticoagulants: The patient has                            taken no previous anticoagulant or antiplatelet                            agents. ASA Grade  Assessment: I - A normal, healthy                            patient. After reviewing the risks and benefits,                            the patient was deemed in satisfactory condition to                            undergo the procedure.                           After obtaining informed consent, the endoscope was                            passed under direct vision. Throughout the                          procedure, the patient's blood pressure, pulse, and                            oxygen saturations were monitored continuously. The                            [id number] ([id number]) scope was introduced through the                            mouth, and advanced to the second part of duodenum.                            The upper GI endoscopy was technically difficult                            and complex due to the patient's agitation.                            Successful completion of the procedure was aided by                            increasing the dose of sedation medication. The                            patient tolerated the procedure. Scope In: Scope Out: Findings:      There is no endoscopic evidence of bleeding, ulcerations or varices in       the entire esophagus.      The Z-line was regular and was found 38 cm from the incisors.      Scattered mild inflammation characterized by congestion (edema),       erosions and erythema was found in the entire examined stomach.      The cardia and gastric fundus were normal on retroflexion.      One non-bleeding superficial duodenal ulcer with no stigmata of bleeding       was found in the duodenal bulb.      Diffuse severe inflammation characterized by  congestion (edema),       erosions, erythema and friability was found in the duodenal bulb.      The second portion of the duodenum was normal.      Bilious fluid was found in the gastric body. The upper GI endoscopy was       technically difficult and complex due to the patient's agitation Impression:               - Z-line regular, 38 cm from the incisors.                           - Gastritis.                           - One non-bleeding duodenal ulcer with no stigmata                            of bleeding.                           - Duodenitis.                           - Normal second portion of the duodenum.                           - Bilious gastric  fluid.                           - No specimens collected. Moderate Sedation:      Moderate (conscious) sedation was administered by the endoscopy nurse       and supervised by the endoscopist. The following parameters were       monitored: oxygen saturation, heart rate, blood pressure, and response       to care. Recommendation:           - Return patient to hospital ward for ongoing care.                           - Clear liquid diet.                           - Continue present medications. Procedure Code(s):        --- Professional ---                           [phone], Esophagogastroduodenoscopy, flexible,                            transoral; diagnostic, including collection of                            specimen(s) by brushing or washing, when performed                            (separate procedure) Diagnosis Code(s):        --- Professional ---  K29.70, Gastritis, unspecified, without bleeding                           K26.9, Duodenal ulcer, unspecified as acute or                            chronic, without hemorrhage or perforation                           K29.80, Duodenitis without bleeding                           K92.0, Hematemesis CPT copyright 2016 [employer]. All rights reserved. The codes documented in this report are preliminary and upon coder review may  be revised to meet current compliance requirements. Kathi Der, MD Kathi Der, MD 08/01/2016 3:39:04 PM Number of Addenda: 0

## 2016-08-02 DIAGNOSIS — K269 Duodenal ulcer, unspecified as acute or chronic, without hemorrhage or perforation: Secondary | ICD-10-CM

## 2016-08-02 DIAGNOSIS — K922 Gastrointestinal hemorrhage, unspecified: Secondary | ICD-10-CM | POA: Diagnosis not present

## 2016-08-02 DIAGNOSIS — K920 Hematemesis: Secondary | ICD-10-CM | POA: Diagnosis not present

## 2016-08-02 DIAGNOSIS — R51 Headache: Secondary | ICD-10-CM | POA: Diagnosis not present

## 2016-08-02 LAB — CBC WITH DIFFERENTIAL/PLATELET
Basophils Absolute: 0 10*3/uL (ref 0.0–0.1)
Basophils Relative: 0 %
Eosinophils Absolute: 0.1 10*3/uL (ref 0.0–0.7)
Eosinophils Relative: 2 %
HEMATOCRIT: 27.8 % — AB (ref 39.0–52.0)
HEMOGLOBIN: 9.7 g/dL — AB (ref 13.0–17.0)
Lymphocytes Relative: 51 %
Lymphs Abs: 2.5 10*3/uL (ref 0.7–4.0)
MCH: 30.2 pg (ref 26.0–34.0)
MCHC: 34.9 g/dL (ref 30.0–36.0)
MCV: 86.6 fL (ref 78.0–100.0)
Monocytes Absolute: 0.5 10*3/uL (ref 0.1–1.0)
Monocytes Relative: 9 %
NEUTROS ABS: 1.9 10*3/uL (ref 1.7–7.7)
NEUTROS PCT: 38 %
Platelets: 208 10*3/uL (ref 150–400)
RBC: 3.21 MIL/uL — AB (ref 4.22–5.81)
RDW: 12.6 % (ref 11.5–15.5)
WBC: 5 10*3/uL (ref 4.0–10.5)

## 2016-08-02 LAB — BASIC METABOLIC PANEL
ANION GAP: 4 — AB (ref 5–15)
BUN: 11 mg/dL (ref 6–20)
CHLORIDE: 107 mmol/L (ref 101–111)
CO2: 29 mmol/L (ref 22–32)
CREATININE: 0.61 mg/dL (ref 0.61–1.24)
Calcium: 8.3 mg/dL — ABNORMAL LOW (ref 8.9–10.3)
GFR calc non Af Amer: >60 mL/min (ref >60)
Glucose, Bld: 97 mg/dL (ref 65–99)
POTASSIUM: 3.8 mmol/L (ref 3.5–5.1)
Sodium: 140 mmol/L (ref 135–145)

## 2016-08-02 LAB — HEMOGLOBIN AND HEMATOCRIT, BLOOD
HEMATOCRIT: 28.2 % — AB (ref 39.0–52.0)
HEMATOCRIT: 27.4 % — AB (ref 39.0–52.0)
Hemoglobin: 9.7 g/dL — ABNORMAL LOW (ref 13.0–17.0)
Hemoglobin: 9.5 g/dL — ABNORMAL LOW (ref 13.0–17.0)

## 2016-08-02 LAB — GLUCOSE, CAPILLARY: GLUCOSE-CAPILLARY: 99 mg/dL (ref 65–99)

## 2016-08-02 LAB — HIV ANTIBODY (ROUTINE TESTING W REFLEX): HIV Screen 4th Generation wRfx: NONREACTIVE

## 2016-08-02 MED ORDER — PANTOPRAZOLE SODIUM 40 MG IV SOLR
40.0000 mg | Freq: Two times a day (BID) | INTRAVENOUS | Status: DC
  Administered 2016-08-02: 40 mg via INTRAVENOUS
  Filled 2016-08-02: qty 40

## 2016-08-02 NOTE — Progress Notes (Signed)
[facility] Progress Note  Joel Compton 26 y.o. 06-10-1990  CC:  GI bleed   Subjective: No further bleeding episodes today. Hemoglobin remained stable. Patient continues to have epigastric discomfort. Denied any nausea vomiting.  ROS : Denied chest pain and shortness of breath. Denied diarrhea. No bowel movement today   Objective: Vital signs in last 24 hours: Vitals:   08/02/16 0800 08/02/16 1000  BP: 117/62 (!) 95/48  Pulse: 76 69  Resp: 14 11  Temp: 97.9 F (36.6 C)     Physical Exam:  General:  Alert, cooperative, no distress, appears stated age  Head:  Normocephalic, without obvious abnormality, atraumatic  Eyes:  , EOM's intact,   Lungs:   Clear to auscultation bilaterally, respirations unlabored  Heart:  Regular rate and rhythm, S1, S2 normal  Abdomen:   Soft, Epigastric tenderness to palpation bowel sounds active all four quadrants,  no masses, no peritoneal signs   Extremities: Extremities normal, atraumatic, no  edema  Pulses: 2+ and symmetric    Lab Results:  Recent Labs  08/01/16 0425 08/02/16 0137  NA 136 140  K 4.5 3.8  CL 104 107  CO2 22 29  GLUCOSE 145* 97  BUN 34* 11  CREATININE 0.75 0.61  CALCIUM 8.5* 8.3*    Recent Labs  08/01/16 0425  AST 32  ALT 21  ALKPHOS 51  BILITOT 1.0  PROT 6.6  ALBUMIN 3.5    Recent Labs  08/01/16 0425  08/02/16 0137 08/02/16 0950  WBC 8.5  --  5.0  --   NEUTROABS 6.3  --  1.9  --   HGB 13.2  < > 9.7*  9.7* 9.5*  HCT 37.6*  < > 27.8*  28.2* 27.4*  MCV 86.4  --  86.6  --   PLT 306  --  208  --   < > = values in this interval not displayed. No results for input(s): LABPROT, INR in the last 72 hours.    Assessment/Plan: - Hematemesis and bright red blood per rectum. EGD yesterday showed severe duodenitis and small clean-based duodenal ulcer. - Acute drop in hemoglobin. Hemoglobin stable. - Epigastric pain. Most likely from underlying duodenitis and ulcer. Had normal CMP on  admission.  Recommendations --------------------------- - Change PPI to IV twice a day.  - Advance diet to GI soft diet. - Monitor hemoglobin. Patient was advised to avoid NSAIDs. - Recommend outpatient colonoscopy. - Recommend discharging patient home on twice a day PPI for at least 2 months. - GI will sign off. Call us back if needed. Follow-up in GI clinic in 3-4 weeks after discharge.    Kathi DerParag Maryl Blalock MD, FACP 08/02/2016, 12:22 PM  Pager [phone]  If no answer or after 5 PM call [phone]

## 2016-08-02 NOTE — Progress Notes (Signed)
PROGRESS NOTE    Joel Compton A Bethune  ZOX:[id number] DOB: September 02, 1990 DOA: 08/01/2016 PCP: Default, Provider, MD     Brief Narrative:  26 yo male presented with hematochezia and hematemesis. Patient known to have chronic headaches. Patient had head trauma 24 hours prior to admission, associated with brief loss of consciousness and forehead laceration. The admission he had lightheadedness and 8 episodes of hematochezia and one episode of hematemesis. Chronic use of NSAIDs. On initial physical examination he was tachycardic 140 bpm, blood pressure 93/65. Mucous membranes were moist, his lungs were clear to auscultation, heart S1-S2 present rhythmic and tachycardic, abdomen had mild tenderness in the epigastrium, no rebound or guarding, no lower extremity edema. Sodium 136, potassium 4.5, chloride 104, bicarbonate 22, glucose 145, BUN 34, creatinine 0.75, white count 8.5, hemoglobin 13.2, hematocrit 37.6, platelets 306. Positive fecal occult blood.  Patient was admitted to the hospital working diagnosis of upper GI bleed, complicated by hypotension and acute blood loss anemia.  Underwent upper endoscopy with non bleeding duodenal ulcer with duodenitis.    Assessment & Plan:   Principal Problem:   Acute upper GI bleed Active Problems:   Forehead laceration   Chronic headaches   1. Acute upper GI bleed due to duodenal ulcer. Will continue 72 hours of IV protonix, diet advanced to clears, will continue to monitoring signs of bleeding. Follow on GI recommendations, will avoid nonsteroidal anti-inflammatory agents. Will hold on IV fluids for now. Will plan to discharge in am if continue to tolerate po diet well and no signs of recurrent bleeding.   2. Acute blood loss anemia. Hb and Hct have been stable, no signs of ongoing bleeding, will follow on cell count in am,.  3. Head trauma. No confusion or agitation, out of bed as tolerated.   4. Chronic headaches. Continue pain control, with tylenol.     DVT prophylaxis: scd Code Status: Full  Family Communication:  Disposition Plan: Home   Consultants:   Gastroenterology   Procedures:    Antimicrobials:     Subjective: Patient with abdominal pain, improved with pain medications, no nausea or vomiting, tolerating clears. No dyspnea or chest pain. No further bleeding.   Objective: Vitals:   08/02/16 0500 08/02/16 0600 08/02/16 0700 08/02/16 0800  BP: 116/65 100/62 (!) 107/49 117/62  Pulse: (!) 58 62 81 76  Resp: 12 14 18 14   Temp:    97.9 F (36.6 C)  TempSrc:    Oral  SpO2: 99% 99% 98% 100%  Weight:      Height:        Intake/Output Summary (Last 24 hours) at 08/02/16 0831 Last data filed at 08/02/16 0500  Gross per 24 hour  Intake           2192.5 ml  Output              802 ml  Net           1390.5 ml   Filed Weights   08/01/16 0645  Weight: 81.8 kg (180 lb 5.4 oz)    Examination:  General exam: deconditioned, not in pain  E ENT. No pallor or icterus, oral mucosa moist.  Respiratory system: Clear to auscultation. Respiratory effort normal. No wheezing, rales or rhonchi.  Cardiovascular system: S1 & S2 heard, RRR. No JVD, murmurs, rubs, gallops or clicks. No pedal edema. Gastrointestinal system: Abdomen is nondistended, soft and nontender. No organomegaly or masses felt. Normal bowel sounds heard. Central nervous system: Alert and oriented. No  focal neurological deficits. Extremities: Symmetric 5 x 5 power. Skin: No rashes, lesions or ulcers    Data Reviewed: I have personally reviewed following labs and imaging studies  CBC:  Recent Labs Lab 08/01/16 0425 08/01/16 1012 08/01/16 1754 08/02/16 0137  WBC 8.5  --   --  5.0  NEUTROABS 6.3  --   --  1.9  HGB 13.2 10.6* 9.7* 9.7*  9.7*  HCT 37.6* 30.1* 27.7* 27.8*  28.2*  MCV 86.4  --   --  86.6  PLT 306  --   --  208   Basic Metabolic Panel:  Recent Labs Lab 08/01/16 0425 08/02/16 0137  NA 136 140  K 4.5 3.8  CL 104 107   CO2 22 29  GLUCOSE 145* 97  BUN 34* 11  CREATININE 0.75 0.61  CALCIUM 8.5* 8.3*   GFR: Estimated Creatinine Clearance: 151.6 mL/min (by C-G formula based on SCr of 0.61 mg/dL). Liver Function Tests:  Recent Labs Lab 08/01/16 0425  AST 32  ALT 21  ALKPHOS 51  BILITOT 1.0  PROT 6.6  ALBUMIN 3.5   No results for input(s): LIPASE, AMYLASE in the last 168 hours. No results for input(s): AMMONIA in the last 168 hours. Coagulation Profile: No results for input(s): INR, PROTIME in the last 168 hours. Cardiac Enzymes: No results for input(s): CKTOTAL, CKMB, CKMBINDEX, TROPONINI in the last 168 hours. BNP (last 3 results) No results for input(s): PROBNP in the last 8760 hours. HbA1C: No results for input(s): HGBA1C in the last 72 hours. CBG:  Recent Labs Lab 08/01/16 0825 08/02/16 0746  GLUCAP 95 99   Lipid Profile: No results for input(s): CHOL, HDL, LDLCALC, TRIG, CHOLHDL, LDLDIRECT in the last 72 hours. Thyroid Function Tests: No results for input(s): TSH, T4TOTAL, FREET4, T3FREE, THYROIDAB in the last 72 hours. Anemia Panel: No results for input(s): VITAMINB12, FOLATE, FERRITIN, TIBC, IRON, RETICCTPCT in the last 72 hours. Sepsis Labs:  Recent Labs Lab 08/01/16 0431  LATICACIDVEN 1.53    Recent Results (from the past 240 hour(s))  MRSA PCR Screening     Status: None   Collection Time: 08/01/16  6:47 AM  Result Value Ref Range Status   MRSA by PCR NEGATIVE NEGATIVE Final    Comment:        The GeneXpert MRSA Assay (FDA approved for NASAL specimens only), is one component of a comprehensive MRSA colonization surveillance program. It is not intended to diagnose MRSA infection nor to guide or monitor treatment for MRSA infections.          Radiology Studies: No results found.      Scheduled Meds: . [START ON 08/04/2016] pantoprazole  40 mg Intravenous Q12H  . sodium chloride flush  3 mL Intravenous Q12H   Continuous Infusions: . dextrose 5  % and 0.9% NaCl 75 mL/hr at 08/02/16 0217  . pantoprozole (PROTONIX) infusion 8 mg/hr (08/02/16 0216)     LOS: 1 day      Alaijah Gibler Annett Gula, MD [facility] Pager [phone]  If 7PM-7AM, please contact night-coverage [email or web address] Password [email or web address] 08/02/2016, 8:31 AM

## 2016-08-03 ENCOUNTER — Encounter (HOSPITAL_COMMUNITY): Payer: Self-pay | Admitting: Gastroenterology

## 2016-08-03 DIAGNOSIS — K2980 Duodenitis without bleeding: Secondary | ICD-10-CM

## 2016-08-03 DIAGNOSIS — K922 Gastrointestinal hemorrhage, unspecified: Secondary | ICD-10-CM | POA: Diagnosis not present

## 2016-08-03 DIAGNOSIS — R51 Headache: Secondary | ICD-10-CM | POA: Diagnosis not present

## 2016-08-03 DIAGNOSIS — K920 Hematemesis: Secondary | ICD-10-CM | POA: Diagnosis not present

## 2016-08-03 LAB — CBC WITH DIFFERENTIAL/PLATELET
BASOS ABS: 0 10*3/uL (ref 0.0–0.1)
Basophils Relative: 1 %
EOS ABS: 0.2 10*3/uL (ref 0.0–0.7)
EOS PCT: 4 %
HCT: 28.5 % — ABNORMAL LOW (ref 39.0–52.0)
Hemoglobin: 9.9 g/dL — ABNORMAL LOW (ref 13.0–17.0)
Lymphocytes Relative: 42 %
Lymphs Abs: 2.1 10*3/uL (ref 0.7–4.0)
MCH: 29.2 pg (ref 26.0–34.0)
MCHC: 34.7 g/dL (ref 30.0–36.0)
MCV: 84.1 fL (ref 78.0–100.0)
Monocytes Absolute: 0.5 10*3/uL (ref 0.1–1.0)
Monocytes Relative: 9 %
NEUTROS PCT: 44 %
Neutro Abs: 2.3 10*3/uL (ref 1.7–7.7)
PLATELETS: 255 10*3/uL (ref 150–400)
RBC: 3.39 MIL/uL — AB (ref 4.22–5.81)
RDW: 12.2 % (ref 11.5–15.5)
WBC: 5.1 10*3/uL (ref 4.0–10.5)

## 2016-08-03 LAB — GLUCOSE, CAPILLARY: GLUCOSE-CAPILLARY: 107 mg/dL — AB (ref 65–99)

## 2016-08-03 MED ORDER — ACETAMINOPHEN 500 MG PO TABS: 500.0000 mg | ORAL_TABLET | Freq: Four times a day (QID) | ORAL | 0 refills | Status: AC | PRN

## 2016-08-03 MED ORDER — PANTOPRAZOLE SODIUM 40 MG PO TBEC
40.0000 mg | DELAYED_RELEASE_TABLET | Freq: Two times a day (BID) | ORAL | Status: DC
  Administered 2016-08-03: 40 mg via ORAL
  Filled 2016-08-03: qty 1

## 2016-08-03 MED ORDER — PANTOPRAZOLE SODIUM 40 MG PO TBEC: 40.0000 mg | DELAYED_RELEASE_TABLET | Freq: Two times a day (BID) | ORAL | 0 refills | Status: AC

## 2016-08-03 NOTE — Care Management Note (Signed)
Case Management Note  Patient Details  Name: Ollen Bowlbrahim A Longenecker MRN: [id number] Date of Birth: 1990-04-13  Subjective/Objective:                  Date:  August 06, 2016  Chart reviewed for concurrent status and case management needs.  Will continue to follow patient progress.  Discharge Planning: following for needs  Expected discharge date: [id number]  Marcelle SmilingRhonda Davis, BSN, [place]RN3, [place]CCM   [phone]   Action/Plan:   Expected Discharge Date:                  Expected Discharge Plan:  Home/Self Care  In-House Referral:     Discharge planning Services  CM Consult  Post Acute Care Choice:    Choice offered to:     DME Arranged:    DME Agency:     HH Arranged:    HH Agency:     Status of Service:  In process, will continue to follow  If discussed at Long Length of Stay Meetings, dates discussed:    Additional Comments:  Golda AcreDavis, Rhonda Lynn, RN 08/03/2016, 10:47 AM

## 2016-08-03 NOTE — Discharge Summary (Signed)
Physician Discharge Summary  YORDIN RHODA NWG:[id number] DOB: 09-Dec-1990 DOA: 08/01/2016  PCP: Default, Provider, MD  Admit date: 08/01/2016 Discharge date: 08/03/2016  Admitted From: Home Disposition:  Home   Recommendations for Outpatient Follow-up:  1. Follow up with PCP in 1- weeks 2. Patient has been placed on pantoprazole bid  Home Health: No  Equipment/Devices: No   Discharge Condition: Home  CODE STATUS: Full  Diet recommendation: Regular   Brief/Interim Summary: 26 yo male presented with hematochezia and hematemesis. Patientknown to have chronic headaches. Patient had head trauma 24 hours prior to admission, associated with brief loss of consciousness and forehead laceration. On this admission he had lightheadedness and 8 episodes of hematochezia and one episode of hematemesis. Chronic use of NSAIDs. On initial physical examination he was tachycardic 140 bpm, blood pressure 93/65. Mucous membranes were moist, his lungs wereclear to auscultation, heart S1-S2 present rhythmic and tachycardic, abdomen had mild tenderness in the epigastrium, no rebound or guarding, no lower extremity edema. Sodium 136, potassium 4.5, chloride 104, bicarbonate 22, glucose 145, BUN 34, creatinine 0.75, white count 8.5, hemoglobin 13.2, hematocrit 37.6, platelets 306. Positive fecal occult blood.   Patient was admitted to the hospital working diagnosis of upper GI bleed, complicated by hypotension and acute blood loss anemia.   1. Acute upper GI bleed due to duodenal ulcer. Patient was admitted to the stepdown unit, he was placed on a continuous infusion of pantoprazole, he was kept nothing by mouth and his hemoglobin and hematocrit were closely monitored. Patient was seen by gastroenterology, he underwent upper endoscopy, findings of gastritis, one nonbleeding duodenal ulcer with no stigmata of bleeding, positive duodenitis. He did not require any blood transfusion, his diet was advanced with good  toleration, patient will be discharged on pantoprazole to take twice daily for the next 2 months. He was strongly advised to avoid nonsteroid anti-inflammatory agents. Patient may need outpatient colonoscopy, follow-up of the chest clinic in 3-4 weeks.  2. Acute blood loss anemia. His hemoglobin in April 2016 was 16.4, his hemoglobin during this hospitalization ranged from  9.5 to 9.7 no blood transfusion was given. Patient was treated for duodenitis and duodenal ulcer with antiacids.   3. Head trauma with chronic headaches. No confusion or agitation, patient was advised to avoid NSAIDs, use Tylenol instead.   Discharge Diagnoses:  Principal Problem:   Acute upper GI bleed Active Problems:   Forehead laceration   Chronic headaches    Discharge Instructions   Allergies as of 08/03/2016   No Known Allergies     Medication List    STOP taking these medications   aspirin-acetaminophen-caffeine 250-250-65 MG tablet Commonly known as:  EXCEDRIN MIGRAINE   ibuprofen 200 MG tablet Commonly known as:  ADVIL,MOTRIN     TAKE these medications   acetaminophen 500 MG tablet Commonly known as:  TYLENOL Take 1 tablet (500 mg total) by mouth every 6 (six) hours as needed.   pantoprazole 40 MG tablet Commonly known as:  PROTONIX Take 1 tablet (40 mg total) by mouth 2 (two) times daily.       No Known Allergies  Consultations:  Gastroenterology   Procedures/Studies: Ct Head Wo Contrast  Result Date: 07/31/2016 CLINICAL DATA:  Injury playing basketball tonight. Fall striking head on goal, positive loss of consciousness. Left frontal laceration and hematoma. EXAM: CT HEAD WITHOUT CONTRAST TECHNIQUE: Contiguous axial images were obtained from the base of the skull through the vertex without intravenous contrast. COMPARISON:  None. FINDINGS: Brain: No  evidence of acute infarction, hemorrhage, hydrocephalus, extra-axial collection or mass lesion/mass effect. Vascular: No hyperdense  vessel or unexpected calcification. Skull: Left frontal scalp laceration and hematoma. No skull fracture. Sinuses/Orbits: Paranasal sinuses and mastoid air cells are clear. The visualized orbits are unremarkable. Other: None. IMPRESSION: Left frontal scalp hematoma and laceration. No skull fracture or acute intracranial abnormality. Electronically Signed   By: Rubye Oaks M.D.   On: 07/31/2016 01:32       Subjective: Patient feeling better, no nausea or vomiting, positive headache, no further significant bleeding.   Discharge Exam: Vitals:   08/02/16 2026 08/03/16 0541  BP: 132/72 109/63  Pulse: 80 78  Resp: 15 15  Temp: 98.8 F (37.1 C) 98.3 F (36.8 C)   Vitals:   08/02/16 1200 08/02/16 1407 08/02/16 2026 08/03/16 0541  BP: (!) 101/47 128/77 132/72 109/63  Pulse: 66 (!) 55 80 78  Resp: 14 18 15 15   Temp: 98.2 F (36.8 C) 97.4 F (36.3 C) 98.8 F (37.1 C) 98.3 F (36.8 C)  TempSrc: Oral Oral Oral Oral  SpO2: 97% 98% 100% 99%  Weight:      Height:        General: Pt is alert, awake, not in acute distress Cardiovascular: RRR, S1/S2 +, no rubs, no gallops Respiratory: CTA bilaterally, no wheezing, no rhonchi Abdominal: Soft, NT, ND, bowel sounds + Extremities: no edema, no cyanosis    The results of significant diagnostics from this hospitalization (including imaging, microbiology, ancillary and laboratory) are listed below for reference.     Microbiology: Recent Results (from the past 240 hour(s))  MRSA PCR Screening     Status: None   Collection Time: 08/01/16  6:47 AM  Result Value Ref Range Status   MRSA by PCR NEGATIVE NEGATIVE Final    Comment:        The GeneXpert MRSA Assay (FDA approved for NASAL specimens only), is one component of a comprehensive MRSA colonization surveillance program. It is not intended to diagnose MRSA infection nor to guide or monitor treatment for MRSA infections.      Labs: BNP (last 3 results) No results for  input(s): BNP in the last 8760 hours. Basic Metabolic Panel:  Recent Labs Lab 08/01/16 0425 08/02/16 0137  NA 136 140  K 4.5 3.8  CL 104 107  CO2 22 29  GLUCOSE 145* 97  BUN 34* 11  CREATININE 0.75 0.61  CALCIUM 8.5* 8.3*   Liver Function Tests:  Recent Labs Lab 08/01/16 0425  AST 32  ALT 21  ALKPHOS 51  BILITOT 1.0  PROT 6.6  ALBUMIN 3.5   No results for input(s): LIPASE, AMYLASE in the last 168 hours. No results for input(s): AMMONIA in the last 168 hours. CBC:  Recent Labs Lab 08/01/16 0425 08/01/16 1012 08/01/16 1754 08/02/16 0137 08/02/16 0950 08/03/16 0635  WBC 8.5  --   --  5.0  --  5.1  NEUTROABS 6.3  --   --  1.9  --  2.3  HGB 13.2 10.6* 9.7* 9.7*  9.7* 9.5* 9.9*  HCT 37.6* 30.1* 27.7* 27.8*  28.2* 27.4* 28.5*  MCV 86.4  --   --  86.6  --  84.1  PLT 306  --   --  208  --  255   Cardiac Enzymes: No results for input(s): CKTOTAL, CKMB, CKMBINDEX, TROPONINI in the last 168 hours. BNP: Invalid input(s): POCBNP CBG:  Recent Labs Lab 08/01/16 0825 08/02/16 0746 08/03/16 0723  GLUCAP 95 99  107*   D-Dimer No results for input(s): DDIMER in the last 72 hours. Hgb A1c No results for input(s): HGBA1C in the last 72 hours. Lipid Profile No results for input(s): CHOL, HDL, LDLCALC, TRIG, CHOLHDL, LDLDIRECT in the last 72 hours. Thyroid function studies No results for input(s): TSH, T4TOTAL, T3FREE, THYROIDAB in the last 72 hours.  Invalid input(s): FREET3 Anemia work up No results for input(s): VITAMINB12, FOLATE, FERRITIN, TIBC, IRON, RETICCTPCT in the last 72 hours. Urinalysis    Component Value Date/Time   COLORURINE YELLOW 11/07/2012 1739   APPEARANCEUR CLEAR 11/07/2012 1739   LABSPEC 1.020 11/07/2012 1739   PHURINE 7.5 11/07/2012 1739   GLUCOSEU NEGATIVE 11/07/2012 1739   HGBUR NEGATIVE 11/07/2012 1739   BILIRUBINUR NEGATIVE 11/07/2012 1739   KETONESUR NEGATIVE 11/07/2012 1739   PROTEINUR NEGATIVE 11/07/2012 1739   UROBILINOGEN  1.0 11/07/2012 1739   NITRITE NEGATIVE 11/07/2012 1739   LEUKOCYTESUR SMALL (A) 11/07/2012 1739   Sepsis Labs Invalid input(s): PROCALCITONIN,  WBC,  LACTICIDVEN Microbiology Recent Results (from the past 240 hour(s))  MRSA PCR Screening     Status: None   Collection Time: 08/01/16  6:47 AM  Result Value Ref Range Status   MRSA by PCR NEGATIVE NEGATIVE Final    Comment:        The GeneXpert MRSA Assay (FDA approved for NASAL specimens only), is one component of a comprehensive MRSA colonization surveillance program. It is not intended to diagnose MRSA infection nor to guide or monitor treatment for MRSA infections.      Time coordinating discharge: Over 30 minutes  SIGNED:   Coralie KeensMauricio Daniel Arrien, MD  [facility] 08/03/2016, 10:27 AM Pager   If 7PM-7AM, please contact night-coverage [email or web address] Password [email or web address]

## 2016-08-05 ENCOUNTER — Ambulatory Visit (HOSPITAL_COMMUNITY)
Admission: EM | Admit: 2016-08-05 | Discharge: 2016-08-05 | Disposition: A | Discharge status: Home / Self Care | Payer: Medicaid Other | Attending: Family Medicine | Admitting: Family Medicine

## 2016-08-05 ENCOUNTER — Encounter (HOSPITAL_COMMUNITY): Payer: Self-pay | Admitting: Emergency Medicine

## 2016-08-05 DIAGNOSIS — D649 Anemia, unspecified: Secondary | ICD-10-CM

## 2016-08-05 DIAGNOSIS — Z4802 Encounter for removal of sutures: Secondary | ICD-10-CM

## 2016-08-05 LAB — POCT I-STAT, CHEM 8
BUN: 10 mg/dL (ref 6–20)
Calcium, Ion: 1.17 mmol/L (ref 1.15–1.40)
Chloride: 99 mmol/L — ABNORMAL LOW (ref 101–111)
Creatinine, Ser: 0.7 mg/dL (ref 0.61–1.24)
Glucose, Bld: 102 mg/dL — ABNORMAL HIGH (ref 65–99)
HCT: 29 % — ABNORMAL LOW (ref 39.0–52.0)
Hemoglobin: 9.9 g/dL — ABNORMAL LOW (ref 13.0–17.0)
Potassium: 3.8 mmol/L (ref 3.5–5.1)
Sodium: 138 mmol/L (ref 135–145)
TCO2: 29 mmol/L (ref 0–100)

## 2016-08-05 NOTE — ED Provider Notes (Signed)
CSN: [id number]     Arrival date & time 08/05/16  1457 History   None    Chief Complaint  Patient presents with  . Suture / Staple Removal   (Consider location/radiation/quality/duration/timing/severity/associated sxs/prior Treatment) 26 year old male presents to clinic for a chief complaint of having sutures removed. Has 3 sutures on his forehead replaced approximately 6 days ago following an injury after playing basketball. He reports no complications, no signs of infection.  He is also requesting to have his H&H checked, was hospitalized 4 days ago for GI bleed, he was also diagnosed with a duodenal ulcer at that time, and his GI doctor has started him on Protonix. States he is continuing to feel.   The history is provided by the patient.  Suture / Staple Removal  Pertinent negatives include no headaches.    History reviewed. No pertinent past medical history. Past Surgical History:  Procedure Laterality Date  . ESOPHAGOGASTRODUODENOSCOPY (EGD) WITH PROPOFOL N/A 08/01/2016   Procedure: ESOPHAGOGASTRODUODENOSCOPY (EGD) WITH PROPOFOL;  Surgeon: Kathi DerBrahmbhatt, Parag, MD;  Location: [facility];  Service: Gastroenterology;  Laterality: N/A;   History reviewed. No pertinent family history. Social History  Substance Use Topics  . Smoking status: Light Tobacco Smoker    Types: Pipe  . Smokeless tobacco: Never Used     Comment: uses hookah socially   . Alcohol use Yes     Comment: occa    Review of Systems  Constitutional: Positive for fatigue. Negative for chills and diaphoresis.  HENT: Negative.   Respiratory: Negative.   Cardiovascular: Negative.   Gastrointestinal: Negative.   Musculoskeletal: Negative.   Skin: Positive for wound.  Neurological: Positive for weakness. Negative for facial asymmetry and headaches.    Allergies  Patient has no known allergies.  Home Medications   Prior to Admission medications   Medication Sig Start Date End Date Taking? Authorizing  Provider  acetaminophen (TYLENOL) 500 MG tablet Take 1 tablet (500 mg total) by mouth every 6 (six) hours as needed. 08/03/16  Yes Arrien, York RamMauricio Daniel, MD  ferrous sulfate 325 (65 FE) MG EC tablet Take 325 mg by mouth 3 (three) times daily with meals.   Yes [provider]  pantoprazole (PROTONIX) 40 MG tablet Take 1 tablet (40 mg total) by mouth 2 (two) times daily. 08/03/16 10/02/16 Yes Arrien, York RamMauricio Daniel, MD   Meds Ordered and Administered this Visit  Medications - No data to display  BP 125/80 (BP Location: Right Arm)   Pulse (!) 59   Temp 98.1 F (36.7 C) (Oral)   SpO2 99%  No data found.   Physical Exam  Constitutional: He is oriented to person, place, and time. He appears well-developed and well-nourished. No distress.  HENT:  Head: Normocephalic and atraumatic.  Right Ear: External ear normal.  Left Ear: External ear normal.  Neck: Normal range of motion. Neck supple.  Cardiovascular: Normal rate and regular rhythm.   Pulmonary/Chest: Effort normal and breath sounds normal.  Neurological: He is alert and oriented to person, place, and time.  Skin: Skin is warm and dry. Capillary refill takes less than 2 seconds. He is not diaphoretic.  Approximately 2 cm laceration for head, 3 sutures in place, wound was well approximated without signs of infection.  Psychiatric: He has a normal mood and affect. His behavior is normal.  Nursing note and vitals reviewed.   Urgent Care Course     .Suture Removal Date/Time: 08/05/2016 4:18 PM Performed by: Dorena BodoKENNARD, Litisha Guagliardo Authorized by: Mardella LaymanHAGLER, BRIAN  Consent:    Consent obtained:  Verbal   Consent given by:  Patient   Risks discussed:  Bleeding Location:    Location:  Head/neck   Head/neck location:  Forehead Procedure details:    Wound appearance:  No signs of infection, good wound healing, clean and nonpurulent   Number of sutures removed:  3 Post-procedure details:    Post-removal:  No dressing applied    Patient tolerance of procedure:  Tolerated well, no immediate complications   (including critical care time)  Labs Review Labs Reviewed  POCT I-STAT, CHEM 8 - Abnormal; Notable for the following:       Result Value   Chloride 99 (*)    Glucose, Bld 102 (*)    Hemoglobin 9.9 (*)    HCT 29.0 (*)    All other components within normal limits    Imaging Review No results found.    MDM   1. Visit for suture removal   2. Anemia, unspecified type    3 sutures removed, wound care and counseling provided.  H&H checked, he is anemic with hemoglobin of 9.9, and hematocrit of 29. Recommending continuing Protonix, pulses recommend starting over-the-counter iron supplement follow-up with his GI doctor as needed.    Dorena Bodo, NP 08/05/16 [phone]

## 2016-08-05 NOTE — Discharge Instructions (Signed)
3 sutures were removed from your wound. Wound appears in good health, with no signs or symptoms of infection. Your hemoglobin and hematocrit are low, this is expected following your GI bleed from earlier this week. Continue the Protonix as prescribed by your GI doctor, and also add an over-the-counter iron supplement as well. I have attached the contact information for [facility], contact them to schedule an appointment to establish for primary care as needed.

## 2016-08-05 NOTE — ED Triage Notes (Signed)
Pt here for suture removal in his head and possibly a hemoglobin check following his GI workup a few days ago.

## 2016-09-11 ENCOUNTER — Emergency Department (HOSPITAL_BASED_OUTPATIENT_CLINIC_OR_DEPARTMENT_OTHER)
Admission: EM | Admit: 2016-09-11 | Discharge: 2016-09-11 | Disposition: A | Discharge status: Home / Self Care | Payer: Self-pay | Attending: Emergency Medicine | Admitting: Emergency Medicine

## 2016-09-11 ENCOUNTER — Encounter (HOSPITAL_BASED_OUTPATIENT_CLINIC_OR_DEPARTMENT_OTHER): Payer: Self-pay | Admitting: Emergency Medicine

## 2016-09-11 DIAGNOSIS — R519 Headache, unspecified: Secondary | ICD-10-CM

## 2016-09-11 DIAGNOSIS — F17200 Nicotine dependence, unspecified, uncomplicated: Secondary | ICD-10-CM | POA: Insufficient documentation

## 2016-09-11 DIAGNOSIS — R51 Headache: Secondary | ICD-10-CM | POA: Insufficient documentation

## 2016-09-11 HISTORY — DX: Gastrointestinal hemorrhage, unspecified: K92.2

## 2016-09-11 HISTORY — DX: Gastric ulcer, unspecified as acute or chronic, without hemorrhage or perforation: K25.9

## 2016-09-11 MED ORDER — DIPHENHYDRAMINE HCL 50 MG/ML IJ SOLN
25.0000 mg | Freq: Once | INTRAMUSCULAR | Status: AC
  Administered 2016-09-11: 25 mg via INTRAVENOUS
  Filled 2016-09-11: qty 1

## 2016-09-11 MED ORDER — SODIUM CHLORIDE 0.9 % IV BOLUS (SEPSIS)
1000.0000 mL | Freq: Once | INTRAVENOUS | Status: AC
Start: 2016-09-11 — End: 2016-09-11
  Administered 2016-09-11: 1000 mL via INTRAVENOUS

## 2016-09-11 MED ORDER — METOCLOPRAMIDE HCL 5 MG/ML IJ SOLN
10.0000 mg | Freq: Once | INTRAMUSCULAR | Status: AC
  Administered 2016-09-11: 10 mg via INTRAVENOUS
  Filled 2016-09-11: qty 2

## 2016-09-11 MED ORDER — KETOROLAC TROMETHAMINE 15 MG/ML IJ SOLN
15.0000 mg | Freq: Once | INTRAMUSCULAR | Status: AC
  Administered 2016-09-11: 15 mg via INTRAVENOUS
  Filled 2016-09-11: qty 1

## 2016-09-11 NOTE — ED Provider Notes (Signed)
[facility] Provider Note: Lowella Dell, MD, FACEP  CSN: [id number] MRN: [id number] ARRIVAL: 09/11/16 at 0040 ROOM: [facility]05/[facility]05   CHIEF COMPLAINT  Headache   HISTORY OF PRESENT ILLNESS  Joel Compton is a 26 y.o. male who complains of a headache for the past 4 days. The onset was gradual. The headache is located in the left side of the head and is described as throbbing. He rates it at 10 out of 10. He has been taking Tylenol one gram every 6 hours without relief. He has had similar headaches in the past and previously took ibuprofen or Excedrin Migraine but has been told to avoid these drugs as he developed a gastric bleed. He has had a decreased appetite due to nausea. The headache has made him anxious and he has been hyperventilating with chest pain and paresthesias of the lateral area, the hands and the right leg. He has felt weak and lightheaded like he might pass out. He admits to not eating or drinking much since the headache began. There is associated photophobia.   Past Medical History:  Diagnosis Date  . GI bleed   . Stomach ulcer     Past Surgical History:  Procedure Laterality Date  . ESOPHAGOGASTRODUODENOSCOPY (EGD) WITH PROPOFOL N/A 08/01/2016   Procedure: ESOPHAGOGASTRODUODENOSCOPY (EGD) WITH PROPOFOL;  Surgeon: Kathi Der, MD;  Location: [facility];  Service: Gastroenterology;  Laterality: N/A;    No family history on file.  Social History  Substance Use Topics  . Smoking status: Light Tobacco Smoker    Types: Pipe  . Smokeless tobacco: Never Used     Comment: uses hookah socially   . Alcohol use Yes     Comment: occa    Prior to Admission medications   Medication Sig Start Date End Date Taking? Authorizing Provider  acetaminophen (TYLENOL) 500 MG tablet Take 1 tablet (500 mg total) by mouth every 6 (six) hours as needed. 08/03/16   Arrien, York Ram, MD  ferrous sulfate 325 (65 FE) MG EC tablet Take 325 mg by mouth 3 (three) times  daily with meals.    [provider]  pantoprazole (PROTONIX) 40 MG tablet Take 1 tablet (40 mg total) by mouth 2 (two) times daily. 08/03/16 10/02/16  Arrien, York Ram, MD    Allergies Patient has no known allergies.   REVIEW OF SYSTEMS  Negative except as noted here or in the History of Present Illness.   PHYSICAL EXAMINATION  Initial Vital Signs Blood pressure (!) 139/91, pulse 86, temperature 99.3 F (37.4 C), temperature source Oral, resp. rate (!) 21, height 5\' 11"  (1.803 m), weight 83 kg (183 lb), SpO2 100 %.  Examination General: Well-developed, well-nourished male in no acute distress; appearance consistent with age of record HENT: normocephalic; atraumatic Eyes: pupils equal, round and reactive to light; extraocular muscles intact Neck: supple Heart: regular rate and rhythm Lungs: clear to auscultation bilaterally; hyperventilating Abdomen: soft; nondistended; nontender; bowel sounds present Extremities: No deformity; full range of motion; pulses normal Neurologic: Awake, alert and oriented; motor function intact in all extremities and symmetric; no facial droop Skin: Warm and dry Psychiatric: Anxious   RESULTS  Summary of this visit's results, reviewed by myself:   EKG Interpretation  Date/Time:  Friday September 11 2016 00:46:26 EDT Ventricular Rate:  85 PR Interval:    QRS Duration: 83 QT Interval:  344 QTC Calculation: 409 R Axis:   66 Text Interpretation:  Sinus rhythm Normal ECG T-wave inversions have resolved Confirmed  by Paula LibraMolpus, Adeena Bernabe ([id number]) on 09/11/2016 12:54:11 AM      Laboratory Studies: No results found for this or any previous visit (from the past 24 hour(s)). Imaging Studies: No results found.  ED COURSE  Nursing notes and initial vitals signs, including pulse oximetry, reviewed.  Vitals:   09/11/16 0047 09/11/16 0048 09/11/16 0053 09/11/16 0301  BP:  (!) 139/91 (!) 139/91 (!) 116/55  Pulse:   86 69  Resp:  (!) 21 (!) 21 14    Temp:   99.3 F (37.4 C)   TempSrc:   Oral   SpO2:   100% 94%  Weight: 83 kg (183 lb)     Height: 5\' 11"  (1.803 m)      3:49 AM Pain almost completely relieved after IV medications. Nausea resolved. Anxiety and hyperventilation resolved. I suspect a migraine.  PROCEDURES    ED DIAGNOSES     ICD-10-CM   1. Bad headache R51        Karron Alvizo, MD 09/11/16 [phone]

## 2016-09-11 NOTE — ED Triage Notes (Signed)
Headache x3 days.  No relief with OTC meds.  Chest pain since yesterday.  SOB today with rapid breathing and tingling of fingers.

## 2016-12-29 ENCOUNTER — Encounter: Payer: Self-pay | Admitting: Family

## 2017-01-28 ENCOUNTER — Encounter: Payer: Self-pay | Admitting: Internal Medicine

## 2017-02-04 MED FILL — metroNIDAZOLE 500 MG TABS: 500 | 14 days supply | Qty: 28 | Fill #0

## 2017-02-04 MED FILL — LANSOPRAZOLE 30 MG CAP: 30 | 30 days supply | Qty: 60 | Fill #0

## 2017-02-04 MED FILL — AMOXICILLIN 500 MG CAPSULE: 500 | 14 days supply | Qty: 56 | Fill #0

## 2017-03-01 DIAGNOSIS — H52223 Regular astigmatism, bilateral: Secondary | ICD-10-CM | POA: Diagnosis not present

## 2017-03-22 MED FILL — DEXTROAMP-AMPHETAMIN 15 MG: 15 | 30 days supply | Qty: 60 | Fill #0

## 2017-04-20 MED FILL — ESOMEPRAZOLE MAG DR 40 MG C: 40 | 30 days supply | Qty: 30 | Fill #0

## 2017-04-21 MED FILL — DEXTROAMP-AMPHETAMIN 15 MG: 15 | 30 days supply | Qty: 60 | Fill #0

## 2017-04-21 MED FILL — VALACYCLOVIR HCL 500 MG TAB: 500 | 15 days supply | Qty: 30 | Fill #0

## 2017-06-14 MED FILL — ESOMEPRAZOLE MAG DR 40 MG C: 40 | 30 days supply | Qty: 30 | Fill #1

## 2017-06-14 MED FILL — DEXTROAMP-AMPHETAMIN 15 MG: 15 | 30 days supply | Qty: 60 | Fill #0

## 2017-06-14 MED FILL — VALACYCLOVIR HCL 500 MG TAB: 500 | 15 days supply | Qty: 30 | Fill #1

## 2017-08-02 MED FILL — DEXTROAMP-AMPHETAMIN 15 MG: 15 | 30 days supply | Qty: 60 | Fill #0

## 2017-09-10 MED FILL — DEXTROAMP-AMPHETAMIN 15 MG: 15 | 30 days supply | Qty: 60 | Fill #0

## 2017-10-08 MED FILL — AMPHETAMINE-DEXTROAMPHETAMI: 15 | 30 days supply | Qty: 60 | Fill #0

## 2017-12-22 MED FILL — AMPHETAMINE-DEXTROAMPHETAMI: 15 | 30 days supply | Qty: 60 | Fill #0

## 2018-02-07 MED FILL — AMPHETAMINE-DEXTROAMPHETAMI: 15 | 30 days supply | Qty: 60 | Fill #0

## 2018-03-15 MED FILL — AMPHETAMINE-DEXTROAMPHETAMI: 15 | 30 days supply | Qty: 60 | Fill #0

## 2018-04-23 IMAGING — CT CT HEAD W/O CM
3 series · 14 of 47 positions shown, 16 images · non-contrast
Comparison: None.

CLINICAL DATA: Injury playing basketball tonight. Fall striking
head on goal, positive loss of consciousness. Left frontal
laceration and hematoma.

EXAM:
CT HEAD WITHOUT CONTRAST
TECHNIQUE: Contiguous axial images were obtained from the base of the skull
through the vertex without intravenous contrast.

[Series 2: head wo · axial · 0.47mm/px · z∈[-192,-67]mm · 8 of 31 slices shown, 10 images]
[im 3/31  brain]
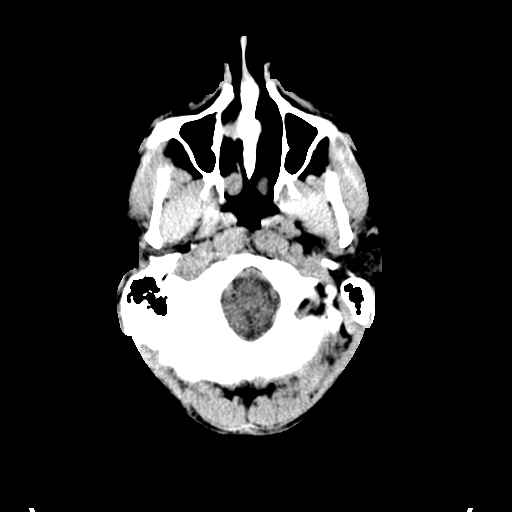
[im 3/31  bone]
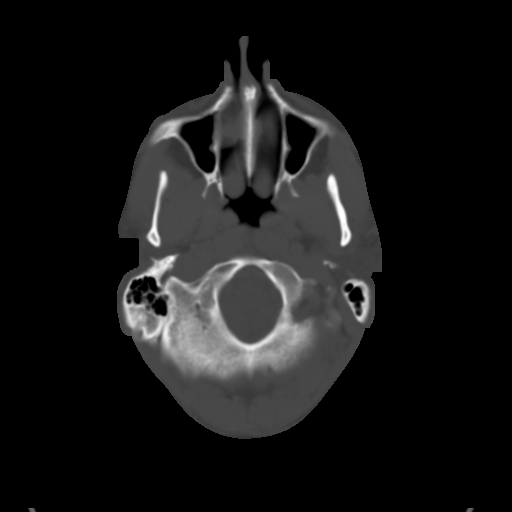
[im 7/31  brain]
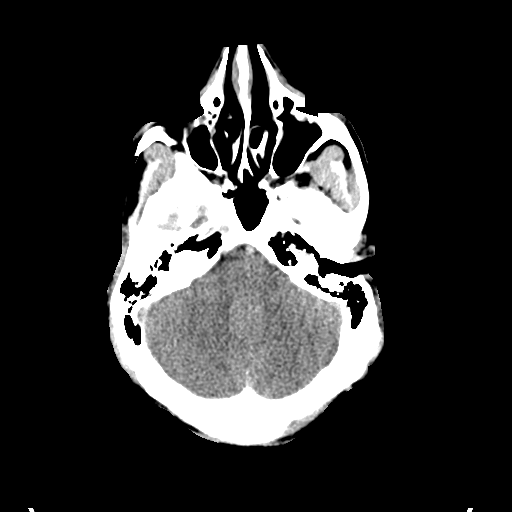
[im 10/31  brain]
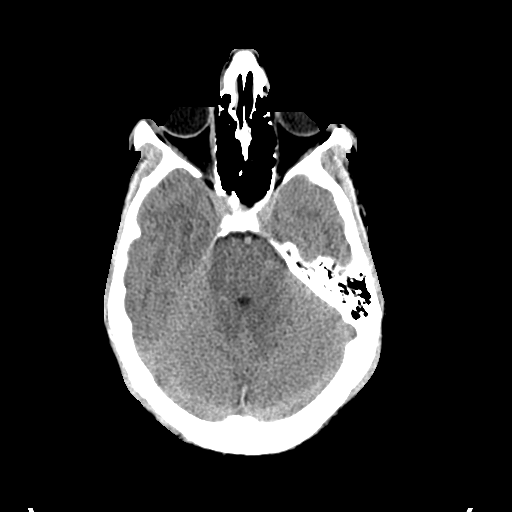
[im 14/31  brain]
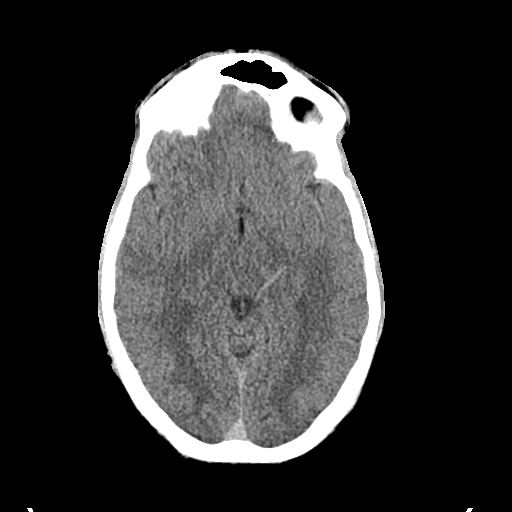
[im 17/31  brain]
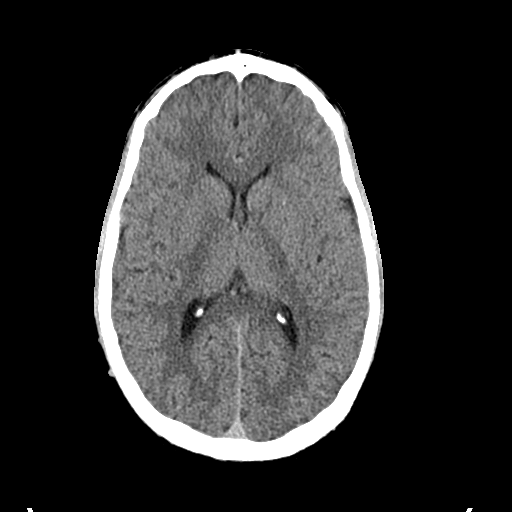
[im 17/31  bone]
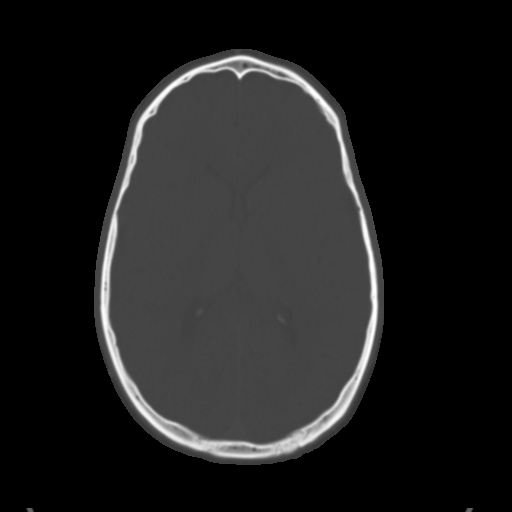
[im 21/31  brain]
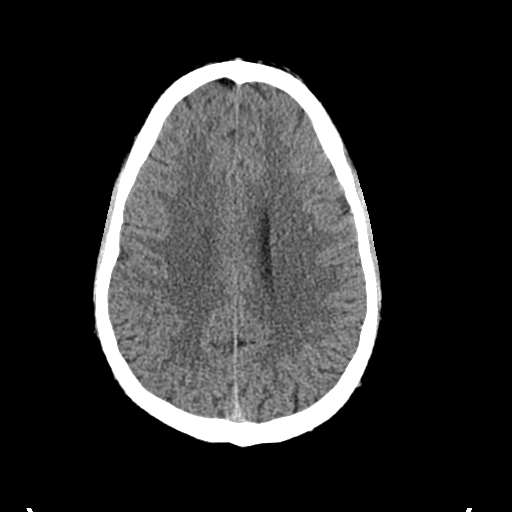
[im 24/31  brain]
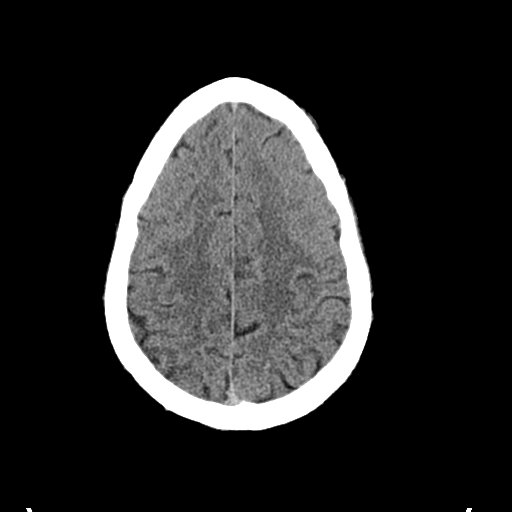
[im 28/31  brain]
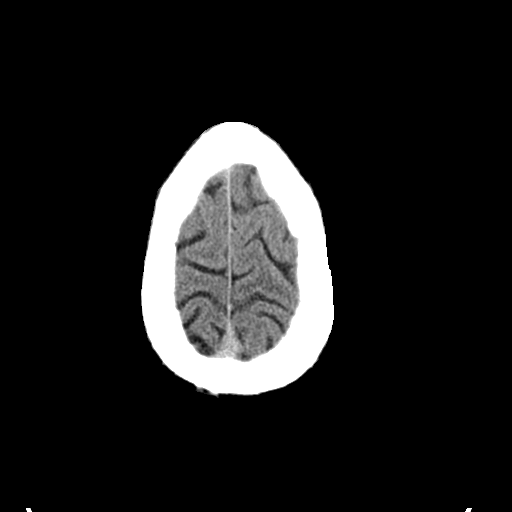

[Series 4: cor soft · coronal · 0.31mm/px · 3 of 69 slices shown]
[im 23/69  brain]
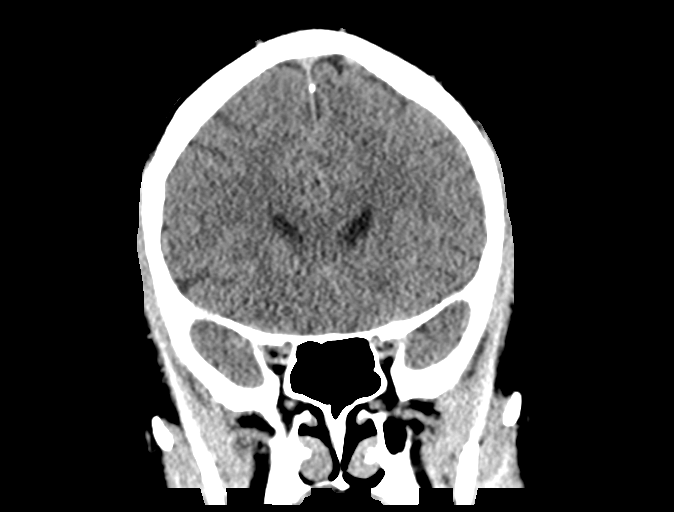
[im 31/69  brain]
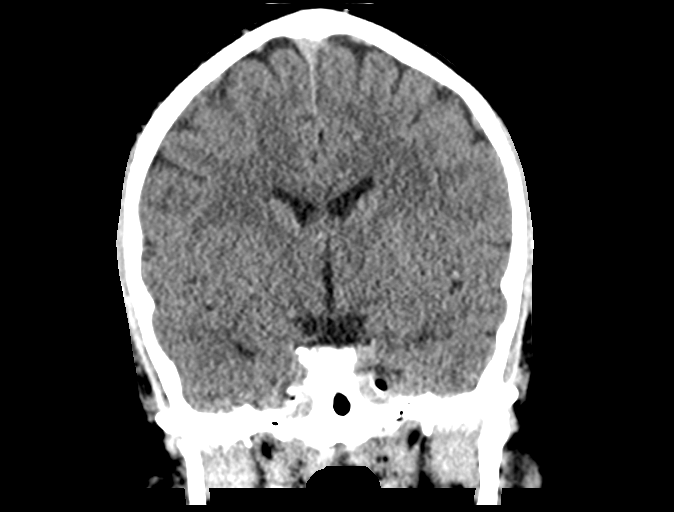
[im 38/69  brain]
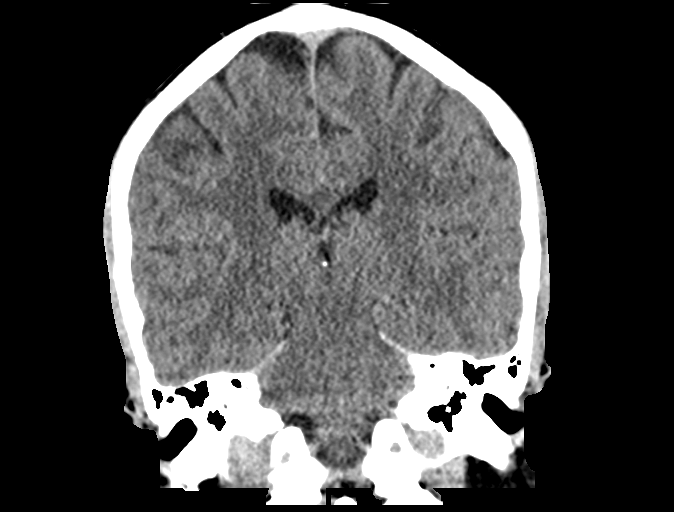

[Series 5: sag soft · sagittal · 0.32mm/px · 3 of 53 slices shown]
[im 18/53  brain]
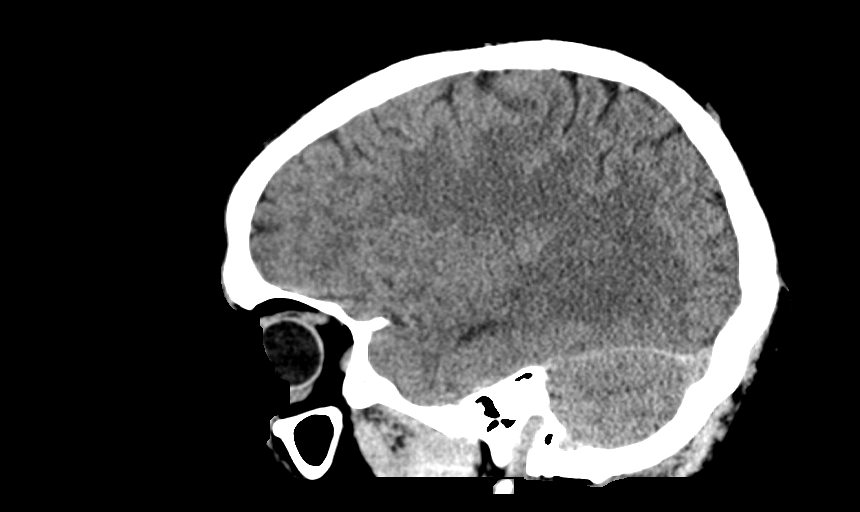
[im 27/53  brain]
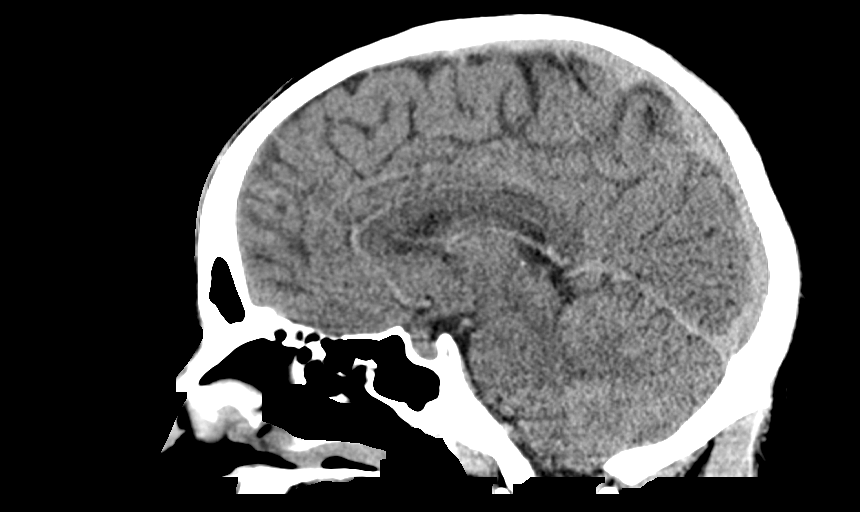
[im 35/53  brain]
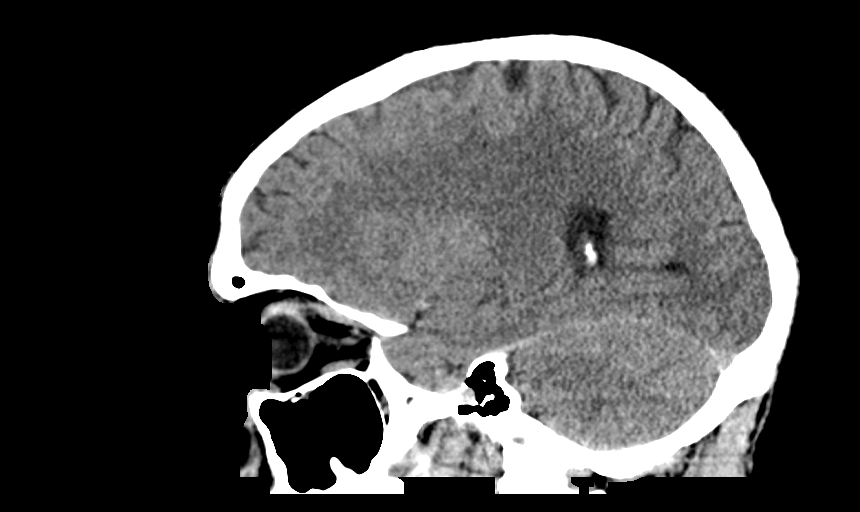

[14 of 47 positions shown; findings below may reference images not displayed]

FINDINGS: Brain: No evidence of acute infarction, hemorrhage, hydrocephalus,
extra-axial collection or mass lesion/mass effect.

Vascular: No hyperdense vessel or unexpected calcification.

Skull: Left frontal scalp laceration and hematoma. No skull
fracture.

Sinuses/Orbits: Paranasal sinuses and mastoid air cells are clear.
The visualized orbits are unremarkable.

Other: None.
IMPRESSION: Left frontal scalp hematoma and laceration. No skull fracture or
acute intracranial abnormality.

## 2018-05-13 MED FILL — AMPHETAMINE-DEXTROAMPHETAMI: 15 | 30 days supply | Qty: 60 | Fill #0

## 2019-01-12 MED FILL — AMPHETAMINE-DEXTROAMPHETAMI: 15 | 30 days supply | Qty: 60 | Fill #0

## 2019-03-13 MED FILL — AMPHETAMINE-DEXTROAMPHETAMI: 15 | 30 days supply | Qty: 60 | Fill #0

## 2019-03-21 ENCOUNTER — Emergency Department (INDEPENDENT_AMBULATORY_CARE_PROVIDER_SITE_OTHER)
Admission: EM | Admit: 2019-03-21 | Discharge: 2019-03-21 | Disposition: A | Discharge status: Home / Self Care | Payer: BC Managed Care – PPO | Source: Home / Self Care | Attending: Family Medicine | Admitting: Family Medicine

## 2019-03-21 ENCOUNTER — Other Ambulatory Visit: Payer: Self-pay

## 2019-03-21 DIAGNOSIS — K645 Perianal venous thrombosis: Secondary | ICD-10-CM

## 2019-03-21 MED ORDER — HYDROCODONE-ACETAMINOPHEN 5-325 MG PO TABS: 1.0000 | ORAL_TABLET | Freq: Four times a day (QID) | ORAL | 0 refills | Status: AC | PRN

## 2019-03-21 MED ORDER — HYDROCORTISONE ACETATE 25 MG RE SUPP: RECTAL | 0 refills | Status: AC

## 2019-03-21 NOTE — ED Triage Notes (Signed)
About 3 weeks ago had a lump around anus.  Now has 2 lumps.  Is unable to sleep.  Hurts all the time.feels like it is getting larger, and has pressure inside also.

## 2019-03-21 NOTE — Discharge Instructions (Addendum)
Change bandage daily until healed.  Keep bandage clean and dry.  May begin sitz baths in about two days.  May begin using rectal suppositories when area has healed. Maintain a high fiber diet.

## 2019-03-21 NOTE — ED Provider Notes (Signed)
[name]    CSN: [id number] Arrival date & time: 03/21/19  1743      History   Chief Complaint Chief Complaint  Patient presents with  . Abscess    HPI Joel Compton is a 29 y.o. male.   About 3 weeks ago patient noticed a painful lump adjacent to his anus.  The lesion has gradually increased in size, and another small painful lesion has appeared.  He has pain during bowel movements and sitting, but feels well otherwise.  The history is provided by the patient.    Past Medical History:  Diagnosis Date  . GI bleed   . Stomach ulcer     Patient Active Problem List   Diagnosis Date Noted  . Acute upper GI bleed 08/01/2016  . Forehead laceration 08/01/2016  . Chronic headaches 08/01/2016  . Head trauma     Past Surgical History:  Procedure Laterality Date  . ESOPHAGOGASTRODUODENOSCOPY (EGD) WITH PROPOFOL N/A 08/01/2016   Procedure: ESOPHAGOGASTRODUODENOSCOPY (EGD) WITH PROPOFOL;  Surgeon: Otis Brace, MD;  Location: [facility];  Service: Gastroenterology;  Laterality: N/A;       Home Medications    Prior to Admission medications   Medication Sig Start Date End Date Taking? Authorizing Provider  acetaminophen (TYLENOL) 500 MG tablet Take 1 tablet (500 mg total) by mouth every 6 (six) hours as needed. 08/03/16   Arrien, Jimmy Picket, MD  amphetamine-dextroamphetamine (ADDERALL) 15 MG tablet Take 1 tablet by mouth 2 (two) times daily. 03/13/19   [provider]  ferrous sulfate 325 (65 FE) MG EC tablet Take 325 mg by mouth 3 (three) times daily with meals.    [provider]  HYDROcodone-acetaminophen (NORCO/VICODIN) 5-325 MG tablet Take 1 tablet by mouth every 6 (six) hours as needed for moderate pain or severe pain. Take one by mouth at bedtime as needed for pain 03/21/19   Kandra Nicolas, MD  hydrocortisone (ANUSOL-HC) 25 MG suppository Place one suppository rectally Q12hr 03/21/19   Kandra Nicolas, MD  pantoprazole  (PROTONIX) 40 MG tablet Take 1 tablet (40 mg total) by mouth 2 (two) times daily. 08/03/16 10/02/16  Arrien, Jimmy Picket, MD    Family History History reviewed. No pertinent family history.  Social History Social History   Tobacco Use  . Smoking status: Light Tobacco Smoker    Types: Pipe  . Smokeless tobacco: Never Used  . Tobacco comment: uses hookah socially   Substance Use Topics  . Alcohol use: Yes    Comment: occa  . Drug use: No     Allergies   Patient has no known allergies.   Review of Systems Review of Systems  Constitutional: Negative for activity change, appetite change, chills, diaphoresis, fatigue and fever.  Gastrointestinal: Positive for constipation and rectal pain. Negative for abdominal pain, anal bleeding, blood in stool, diarrhea, nausea and vomiting.  All other systems reviewed and are negative.    Physical Exam Triage Vital Signs ED Triage Vitals  Enc Vitals Group     BP 03/21/19 1848 (!) 147/87     Pulse Rate 03/21/19 1848 93     Resp 03/21/19 1848 20     Temp 03/21/19 1848 98.5 F (36.9 C)     Temp Source 03/21/19 1848 Oral     SpO2 03/21/19 1848 98 %     Weight 03/21/19 1848 158 lb (71.7 kg)     Height 03/21/19 1848 5\' 11"  (1.803 m)     Head Circumference --  Peak Flow --      Pain Score 03/21/19 1849 10     Pain Loc --      Pain Edu? --      Excl. in GC? --    No data found.  Updated Vital Signs BP (!) 147/87 (BP Location: Right Arm)   Pulse 93   Temp 98.5 F (36.9 C) (Oral)   Resp 20   Ht 5\' 11"  (1.803 m)   Wt 71.7 kg   SpO2 98%   BMI 22.04 kg/m   Visual Acuity Right Eye Distance:   Left Eye Distance:   Bilateral Distance:    Right Eye Near:   Left Eye Near:    Bilateral Near:     Physical Exam Vitals and nursing note reviewed.  Constitutional:      General: He is not in acute distress.    Appearance: Normal appearance.  HENT:     Head: Normocephalic.  Eyes:     Pupils: Pupils are equal, round, and  reactive to light.  Cardiovascular:     Rate and Rhythm: Normal rate.  Pulmonary:     Effort: Pulmonary effort is normal.  Abdominal:     General: Abdomen is flat.     Tenderness: There is no abdominal tenderness.  Genitourinary:      Comments: Anus has two adjacent thrombosed hemorrhoids:  2cm diameter and 1.5cm diameter, tender to palpation Skin:    General: Skin is warm and dry.  Neurological:     Mental Status: He is alert.      UC Treatments / Results  Labs (all labs ordered are listed, but only abnormal results are displayed) Labs Reviewed - No data to display  EKG   Radiology No results found.  Procedures Procedures  I and D thrombosed hemorrhoid After explaining risks/benefits of procedure and obtaining consent from patient, cleaned anus and surrounding area with Betadine.  Using sterile technique, obtained local anesthesia with topical refrigerant spray and local 1% lidocaine with epinephrine.  Then made a 2mm radial incision in each of two thrombosed hemorrhoids with #11 blade, extracting several fragments of clotted blood.  Applied sterile absorbent dressing.  Wound precautions given.     Medications Ordered in UC Medications - No data to display  Initial Impression / Assessment and Plan / UC Course  I have reviewed the triage vital signs and the nursing notes.  Pertinent labs & imaging results that were available during my care of the patient were reviewed by me and considered in my medical decision making (see chart for details).    Rx for Lortab (#10, no refill), and Anusol HC suppositories. Controlled Substance Prescriptions I have consulted the [place] Controlled Substances Registry for this patient, and feel the risk/benefit ratio today is favorable for proceeding with this prescription for a controlled substance.   Followup with Family Doctor if not improved in one week.    Final Clinical Impressions(s) / UC Diagnoses   Final diagnoses:  External  thrombosed hemorrhoids     Discharge Instructions     Change bandage daily until healed.  Keep bandage clean and dry.  May begin sitz baths in about two days.  May begin using rectal suppositories when area has healed. Maintain a high fiber diet.    ED Prescriptions    Medication Sig Dispense Auth. Provider   HYDROcodone-acetaminophen (NORCO/VICODIN) 5-325 MG tablet Take 1 tablet by mouth every 6 (six) hours as needed for moderate pain or severe pain. Take one by  mouth at bedtime as needed for pain 10 tablet Lattie Haw, MD   hydrocortisone (ANUSOL-HC) 25 MG suppository Place one suppository rectally Q12hr 12 suppository Cathren Harsh Tera Mater, MD        Lattie Haw, MD 03/22/19 2114

## 2019-03-27 MED FILL — VALACYCLOVIR HCL 500 MG TAB: 500 | 15 days supply | Qty: 30 | Fill #0

## 2019-03-27 MED FILL — ESOMEPRAZOLE MAG DR 40 MG C: 40 | 30 days supply | Qty: 30 | Fill #0

## 2019-04-10 MED FILL — VIT D2 1.25 MG (50,000 UNIT: 1.25 MG | 30 days supply | Qty: 10 | Fill #0

## 2019-04-10 MED FILL — VALACYCLOVIR HCL 500 MG TAB: 500 | 15 days supply | Qty: 30 | Fill #0

## 2019-04-10 MED FILL — TAMSULOSIN HCL 0.4 MG CAP: 0.4 | 30 days supply | Qty: 60 | Fill #0

## 2019-04-10 MED FILL — ALBUTEROL SULFATE HFA 108 (: 108 (90 BAS | 17 days supply | Qty: 18 | Fill #0

## 2019-04-14 MED FILL — SODIUM FLUORIDE 5000 PPM 1.: 1.1 | 30 days supply | Qty: 100 | Fill #0

## 2019-04-21 MED FILL — AMPHETAMINE-DEXTROAMPHETAMI: 15 | 30 days supply | Qty: 60 | Fill #0

## 2019-06-06 MED FILL — VIT D2 1.25 MG (50,000 UNIT: 1.25 MG | 30 days supply | Qty: 10 | Fill #1

## 2019-06-06 MED FILL — VALACYCLOVIR HCL 500 MG TAB: 500 | 15 days supply | Qty: 30 | Fill #1

## 2019-06-20 MED FILL — ALBUTEROL SULFATE HFA 108 (: 108 (90 BAS | 17 days supply | Qty: 18 | Fill #1

## 2019-06-24 MED FILL — VALACYCLOVIR HCL 500 MG TAB: 500 | 15 days supply | Qty: 30 | Fill #1

## 2019-07-10 MED FILL — TAMSULOSIN HCL 0.4 MG CAP: 0.4 | 30 days supply | Qty: 60 | Fill #1

## 2019-07-10 MED FILL — VIT D2 1.25 MG (50,000 UNIT: 1.25 MG | 30 days supply | Qty: 10 | Fill #1

## 2019-07-10 MED FILL — VALACYCLOVIR HCL 500 MG TAB: 500 | 15 days supply | Qty: 30 | Fill #1

## 2019-07-10 MED FILL — ALBUTEROL SULFATE HFA 108 (: 108 (90 BAS | 17 days supply | Qty: 18 | Fill #2

## 2019-08-10 MED FILL — AMPHETAMINE-DEXTROAMPHETAMI: 15 | 30 days supply | Qty: 60 | Fill #0

## 2019-10-03 ENCOUNTER — Other Ambulatory Visit: Payer: BC Managed Care – PPO

## 2019-10-03 ENCOUNTER — Other Ambulatory Visit: Payer: Self-pay

## 2019-10-03 DIAGNOSIS — Z20822 Contact with and (suspected) exposure to covid-19: Secondary | ICD-10-CM

## 2019-10-04 LAB — SARS-COV-2, NAA 2 DAY TAT

## 2019-10-04 LAB — NOVEL CORONAVIRUS, NAA: SARS-CoV-2, NAA: DETECTED — AB

## 2019-10-05 ENCOUNTER — Emergency Department (HOSPITAL_BASED_OUTPATIENT_CLINIC_OR_DEPARTMENT_OTHER)
Admission: EM | Admit: 2019-10-05 | Discharge: 2019-10-05 | Disposition: A | Discharge status: Home / Self Care | Payer: HRSA Program | Attending: Emergency Medicine | Admitting: Emergency Medicine

## 2019-10-05 ENCOUNTER — Emergency Department (HOSPITAL_BASED_OUTPATIENT_CLINIC_OR_DEPARTMENT_OTHER): Payer: HRSA Program

## 2019-10-05 ENCOUNTER — Other Ambulatory Visit: Payer: Self-pay

## 2019-10-05 ENCOUNTER — Encounter (HOSPITAL_BASED_OUTPATIENT_CLINIC_OR_DEPARTMENT_OTHER): Payer: Self-pay | Admitting: Emergency Medicine

## 2019-10-05 DIAGNOSIS — F17290 Nicotine dependence, other tobacco product, uncomplicated: Secondary | ICD-10-CM | POA: Insufficient documentation

## 2019-10-05 DIAGNOSIS — M791 Myalgia, unspecified site: Secondary | ICD-10-CM

## 2019-10-05 DIAGNOSIS — U071 COVID-19: Secondary | ICD-10-CM | POA: Diagnosis not present

## 2019-10-05 DIAGNOSIS — R0602 Shortness of breath: Secondary | ICD-10-CM | POA: Diagnosis present

## 2019-10-05 LAB — URINALYSIS, ROUTINE W REFLEX MICROSCOPIC
Bilirubin Urine: NEGATIVE
Glucose, UA: NEGATIVE mg/dL
Hgb urine dipstick: NEGATIVE
Ketones, ur: NEGATIVE mg/dL
Leukocytes,Ua: NEGATIVE
Nitrite: NEGATIVE
Protein, ur: NEGATIVE mg/dL
Specific Gravity, Urine: 1.01 (ref 1.005–1.030)
pH: 7.5 (ref 5.0–8.0)

## 2019-10-05 MED ORDER — IBUPROFEN 800 MG PO TABS: 800.0000 mg | ORAL_TABLET | Freq: Three times a day (TID) | ORAL | 0 refills | Status: AC

## 2019-10-05 MED ORDER — AEROCHAMBER PLUS FLO-VU MEDIUM MISC
1.0000 | Freq: Once | Status: AC
  Administered 2019-10-05: 1
  Filled 2019-10-05: qty 1

## 2019-10-05 MED ORDER — IBUPROFEN 800 MG PO TABS
800.0000 mg | ORAL_TABLET | Freq: Once | ORAL | Status: AC
  Administered 2019-10-05: 800 mg via ORAL
  Filled 2019-10-05: qty 1

## 2019-10-05 MED ORDER — ONDANSETRON 4 MG PO TBDP: 4.0000 mg | ORAL_TABLET | ORAL | 0 refills | Status: AC | PRN

## 2019-10-05 MED ORDER — BENZONATATE 100 MG PO CAPS: 100.0000 mg | ORAL_CAPSULE | Freq: Three times a day (TID) | ORAL | 0 refills | Status: AC

## 2019-10-05 MED ORDER — ALBUTEROL SULFATE HFA 108 (90 BASE) MCG/ACT IN AERS
2.0000 | INHALATION_SPRAY | Freq: Once | RESPIRATORY_TRACT | Status: AC
  Administered 2019-10-05: 2 via RESPIRATORY_TRACT
  Filled 2019-10-05: qty 6.7

## 2019-10-05 MED ORDER — ACETAMINOPHEN 500 MG PO TABS: 1000.0000 mg | ORAL_TABLET | Freq: Four times a day (QID) | ORAL | 0 refills | Status: AC | PRN

## 2019-10-05 MED ORDER — ACETAMINOPHEN 500 MG PO TABS
1000.0000 mg | ORAL_TABLET | Freq: Once | ORAL | Status: AC
  Administered 2019-10-05: 1000 mg via ORAL
  Filled 2019-10-05: qty 2

## 2019-10-05 MED FILL — AZITHROMYCIN 250 MG TABS: 250 | 5 days supply | Qty: 6 | Fill #0

## 2019-10-05 MED FILL — predniSONE 10 MG TABS: 10 | 20 days supply | Qty: 20 | Fill #0

## 2019-10-05 NOTE — ED Triage Notes (Signed)
Pt reports testing positive for COVID yesterday; pt reports feeling SHOB and has body aches; pt not showing signs of respiratory distress; pt reports being told by the testing facility to come to the ED if symptoms get worse

## 2019-10-05 NOTE — Discharge Instructions (Signed)
1.  Take ibuprofen 800 mg every 8 hours for body aches and pain.  You may also take acetaminophen 1000 mg every 6 hours for aches pain and fever.  These medications can be taken together for severe pain and fever. 2.  Use the inhaler as instructed every 4 hours as needed for cough or shortness of breath. 3.  You have been given a prescription for Zofran.  This medication is for nausea you may use this if needed every 4 hours. 4.  Schedule a recheck with your family doctor.  If you do not have a family doctor use the referral number in your discharge instructions to find one.  Return to the emergency department if you have worsening shortness of breath.  Your symptoms may last for several weeks.  You will have coughing, you may have nausea vomiting and abdominal pain, headaches and body aches.

## 2019-10-05 NOTE — ED Provider Notes (Signed)
[facility] EMERGENCY DEPARTMENT Provider Note   CSN: [id number] Arrival date & time: 10/05/19  0449     History Chief Complaint  Patient presents with  . Shortness of Breath    Joel Compton is a 29 y.o. male.  HPI Patient started developing his symptoms of coughing and body aches 2 days ago.  Yesterday he got a Covid test that was positive.  He reports he is having a lot of aching in his lower back and joints.  He reports he is also having coughing.  He feels somewhat short of breath.  He reports it was hard to sleep last night.  He has not had a documented fever.  No nausea vomiting or diarrhea at this time.  No sore throat or difficulty swallowing.  No pain or burning with urination but he does perceive that he has some discomfort in his low back and suprapubic area when he urinates.  Patient has not been sexually active for 8 months.  No penile pain or burning or discharge.  Patient has not had Covid immunization.    Past Medical History:  Diagnosis Date  . GI bleed   . Stomach ulcer     Patient Active Problem List   Diagnosis Date Noted  . Acute upper GI bleed 08/01/2016  . Forehead laceration 08/01/2016  . Chronic headaches 08/01/2016  . Head trauma     Past Surgical History:  Procedure Laterality Date  . ESOPHAGOGASTRODUODENOSCOPY (EGD) WITH PROPOFOL N/A 08/01/2016   Procedure: ESOPHAGOGASTRODUODENOSCOPY (EGD) WITH PROPOFOL;  Surgeon: Kathi Der, MD;  Location: [facility];  Service: Gastroenterology;  Laterality: N/A;       No family history on file.  Social History   Tobacco Use  . Smoking status: Light Tobacco Smoker    Types: Pipe  . Smokeless tobacco: Never Used  . Tobacco comment: uses hookah socially   Substance Use Topics  . Alcohol use: Yes    Comment: occa  . Drug use: No    Home Medications Prior to Admission medications   Medication Sig Start Date End Date Taking? Authorizing Provider  acetaminophen (TYLENOL) 500 MG  tablet Take 1 tablet (500 mg total) by mouth every 6 (six) hours as needed. 08/03/16   Arrien, York Ram, MD  acetaminophen (TYLENOL) 500 MG tablet Take 2 tablets (1,000 mg total) by mouth every 6 (six) hours as needed. 10/05/19   Arby Barrette, MD  amphetamine-dextroamphetamine (ADDERALL) 15 MG tablet Take 1 tablet by mouth 2 (two) times daily. 03/13/19   [provider]  benzonatate (TESSALON) 100 MG capsule Take 1 capsule (100 mg total) by mouth every 8 (eight) hours. 10/05/19   Arby Barrette, MD  ferrous sulfate 325 (65 FE) MG EC tablet Take 325 mg by mouth 3 (three) times daily with meals.    [provider]  HYDROcodone-acetaminophen (NORCO/VICODIN) 5-325 MG tablet Take 1 tablet by mouth every 6 (six) hours as needed for moderate pain or severe pain. Take one by mouth at bedtime as needed for pain 03/21/19   Lattie Haw, MD  hydrocortisone (ANUSOL-HC) 25 MG suppository Place one suppository rectally Q12hr 03/21/19   Lattie Haw, MD  ibuprofen (ADVIL) 800 MG tablet Take 1 tablet (800 mg total) by mouth 3 (three) times daily. 10/05/19   Arby Barrette, MD  ondansetron (ZOFRAN ODT) 4 MG disintegrating tablet Take 1 tablet (4 mg total) by mouth every 4 (four) hours as needed for nausea or vomiting. 10/05/19   Arby Barrette,  MD  pantoprazole (PROTONIX) 40 MG tablet Take 1 tablet (40 mg total) by mouth 2 (two) times daily. 08/03/16 10/02/16  Arrien, York Ram, MD    Allergies    Patient has no known allergies.  Review of Systems   Review of Systems 10 systems reviewed and negative except as per HPI Physical Exam Updated Vital Signs BP (!) 133/92 (BP Location: Left Arm)   Pulse 90   Temp 98.5 F (36.9 C) (Oral)   Resp 17   SpO2 100%   Physical Exam Constitutional:      Appearance: He is well-developed.     Comments: Patient is alert and nontoxic.  No respiratory distress.  Nourished well-developed.  HENT:     Head: Normocephalic and atraumatic.    Eyes:     Extraocular Movements: Extraocular movements intact.  Cardiovascular:     Rate and Rhythm: Normal rate and regular rhythm.     Heart sounds: Normal heart sounds.  Pulmonary:     Effort: Pulmonary effort is normal.     Breath sounds: Normal breath sounds.  Abdominal:     General: Bowel sounds are normal. There is no distension.     Palpations: Abdomen is soft.     Tenderness: There is no abdominal tenderness.  Musculoskeletal:        General: Normal range of motion.     Cervical back: Neck supple.  Skin:    General: Skin is warm and dry.  Neurological:     Mental Status: He is alert and oriented to person, place, and time.     GCS: GCS eye subscore is 4. GCS verbal subscore is 5. GCS motor subscore is 6.     Coordination: Coordination normal.     ED Results / Procedures / Treatments   Labs (all labs ordered are listed, but only abnormal results are displayed) Labs Reviewed  URINALYSIS, ROUTINE W REFLEX MICROSCOPIC    EKG None  Radiology DG Chest Port 1 View  Result Date: 10/05/2019 CLINICAL DATA:  [facility] tested positive for COVID yesterday shortness of breath. EXAM: PORTABLE CHEST 1 VIEW COMPARISON:  Two-view chest x-ray 06/06/2014. FINDINGS: The heart size and mediastinal contours are within normal limits. Both lungs are clear. The visualized skeletal structures are unremarkable. IMPRESSION: No active disease. Electronically Signed   By: Marin Roberts M.D.   On: 10/05/2019 07:04    Procedures Procedures (including critical care time)  Medications Ordered in ED Medications  albuterol (VENTOLIN HFA) 108 (90 Base) MCG/ACT inhaler 2 puff (2 puffs Inhalation Given 10/05/19 0732)  AeroChamber Plus Flo-Vu Medium MISC 1 each (1 each Other Given 10/05/19 0733)  ibuprofen (ADVIL) tablet 800 mg (800 mg Oral Given 10/05/19 0728)  acetaminophen (TYLENOL) tablet 1,000 mg (1,000 mg Oral Given 10/05/19 0962)    ED Course  I have reviewed the triage vital signs and  the nursing notes.  Pertinent labs & imaging results that were available during my care of the patient were reviewed by me and considered in my medical decision making (see chart for details).    MDM Rules/Calculators/A&P                          Patient presents having had positive Covid test yesterday.  He is presenting for myalgias and aching in his extremities.  He is also having coughing and felt short of breath overnight.  Patient's chest x-ray is clear.  Vital signs are normal.  Patient does not  have risk factors for severe disease.  At this time, will continue home symptomatic management with return precautions.  Patient did have some perception of suprapubic discomfort and flank discomfort with urinating but no penile burning or drainage.  Patient has not been sexually active for 6 months.  UA negative.  Joel Compton was evaluated in Emergency Department on 10/05/2019 for the symptoms described in the history of present illness. He was evaluated in the context of the global COVID-19 pandemic, which necessitated consideration that the patient might be at risk for infection with the SARS-CoV-2 virus that causes COVID-19. Institutional protocols and algorithms that pertain to the evaluation of patients at risk for COVID-19 are in a state of rapid change based on information released by regulatory bodies including the CDC and federal and state organizations. These policies and algorithms were followed during the patient's care in the ED. Final Clinical Impression(s) / ED Diagnoses Final diagnoses:  COVID-19  Myalgia    Rx / DC Orders ED Discharge Orders         Ordered    ibuprofen (ADVIL) 800 MG tablet  3 times daily     Discontinue  Reprint     10/05/19 0803    acetaminophen (TYLENOL) 500 MG tablet  Every 6 hours PRN     Discontinue  Reprint     10/05/19 0803    ondansetron (ZOFRAN ODT) 4 MG disintegrating tablet  Every 4 hours PRN     Discontinue  Reprint     10/05/19 0803     benzonatate (TESSALON) 100 MG capsule  Every 8 hours     Discontinue  Reprint     10/05/19 1572           Arby Barrette, MD 10/05/19 [phone]

## 2019-10-10 ENCOUNTER — Other Ambulatory Visit: Payer: BC Managed Care – PPO

## 2019-10-11 ENCOUNTER — Other Ambulatory Visit: Payer: Self-pay

## 2019-11-02 MED FILL — VALACYCLOVIR HCL 500 MG TAB: 500 | 15 days supply | Qty: 30 | Fill #2

## 2019-11-20 MED FILL — ALBUTEROL SULFATE HFA 108 (: 108 (90 BAS | 17 days supply | Qty: 9 | Fill #0

## 2019-11-23 MED FILL — VALACYCLOVIR HCL 500 MG TAB: 500 | 15 days supply | Qty: 30 | Fill #0

## 2019-11-23 MED FILL — predniSONE 10 MG TABS: 10 | 20 days supply | Qty: 20 | Fill #0

## 2020-02-15 ENCOUNTER — Other Ambulatory Visit (HOSPITAL_COMMUNITY): Payer: Self-pay | Admitting: Family

## 2020-02-19 MED FILL — ALBUTEROL SULFATE HFA 108 (: 108 (90 BAS | 16 days supply | Qty: 9 | Fill #0

## 2020-02-19 MED FILL — VIT D2 1.25 MG (50,000 UNIT: 1.25 MG | 90 days supply | Qty: 30 | Fill #0

## 2020-02-19 MED FILL — VALACYCLOVIR HCL 500 MG TAB: 500 | 30 days supply | Qty: 60 | Fill #0

## 2020-02-19 MED FILL — predniSONE 10 MG TABS: 10 | 20 days supply | Qty: 20 | Fill #0

## 2020-02-19 MED FILL — TAMSULOSIN HCL 0.4 MG CAP: 0.4 | 90 days supply | Qty: 180 | Fill #0

## 2020-05-14 MED FILL — VALACYCLOVIR HCL 500 MG TAB: 500 | 180 days supply | Qty: 360 | Fill #1

## 2020-05-24 MED FILL — VALACYCLOVIR HCL 500 MG TAB: 500 | 180 days supply | Qty: 360 | Fill #1

## 2020-11-16 ENCOUNTER — Other Ambulatory Visit (HOSPITAL_COMMUNITY): Payer: Self-pay

## 2020-12-02 ENCOUNTER — Other Ambulatory Visit (HOSPITAL_COMMUNITY): Payer: Self-pay

## 2020-12-02 MED ORDER — PANTOPRAZOLE SODIUM 40 MG PO TBEC
DELAYED_RELEASE_TABLET | ORAL | 0 refills | Status: AC
Start: 2020-12-02 — End: ?
  Filled 2020-12-02: qty 180, 180d supply, fill #0

## 2020-12-02 MED ORDER — VALACYCLOVIR HCL 500 MG PO TABS
ORAL_TABLET | ORAL | 0 refills | Status: AC
  Filled 2020-12-02: qty 360, 180d supply, fill #0

## 2020-12-03 ENCOUNTER — Other Ambulatory Visit (HOSPITAL_COMMUNITY): Payer: Self-pay

## 2020-12-05 ENCOUNTER — Other Ambulatory Visit (HOSPITAL_COMMUNITY): Payer: Self-pay

## 2020-12-05 MED ORDER — TAMSULOSIN HCL 0.4 MG PO CAPS
ORAL_CAPSULE | ORAL | 1 refills | Status: AC
  Filled 2020-12-05: qty 360, 180d supply, fill #0

## 2020-12-09 ENCOUNTER — Other Ambulatory Visit (HOSPITAL_COMMUNITY): Payer: Self-pay

## 2021-06-27 IMAGING — DX DG CHEST 1V PORT
2 series · 2 of 2 positions shown · non-contrast
Comparison: Two-view chest x-ray 06/06/2014.

CLINICAL DATA: [facility] tested positive for COVID yesterday shortness
of breath.

EXAM:
PORTABLE CHEST 1 VIEW

[chest ap (1 of 2)]
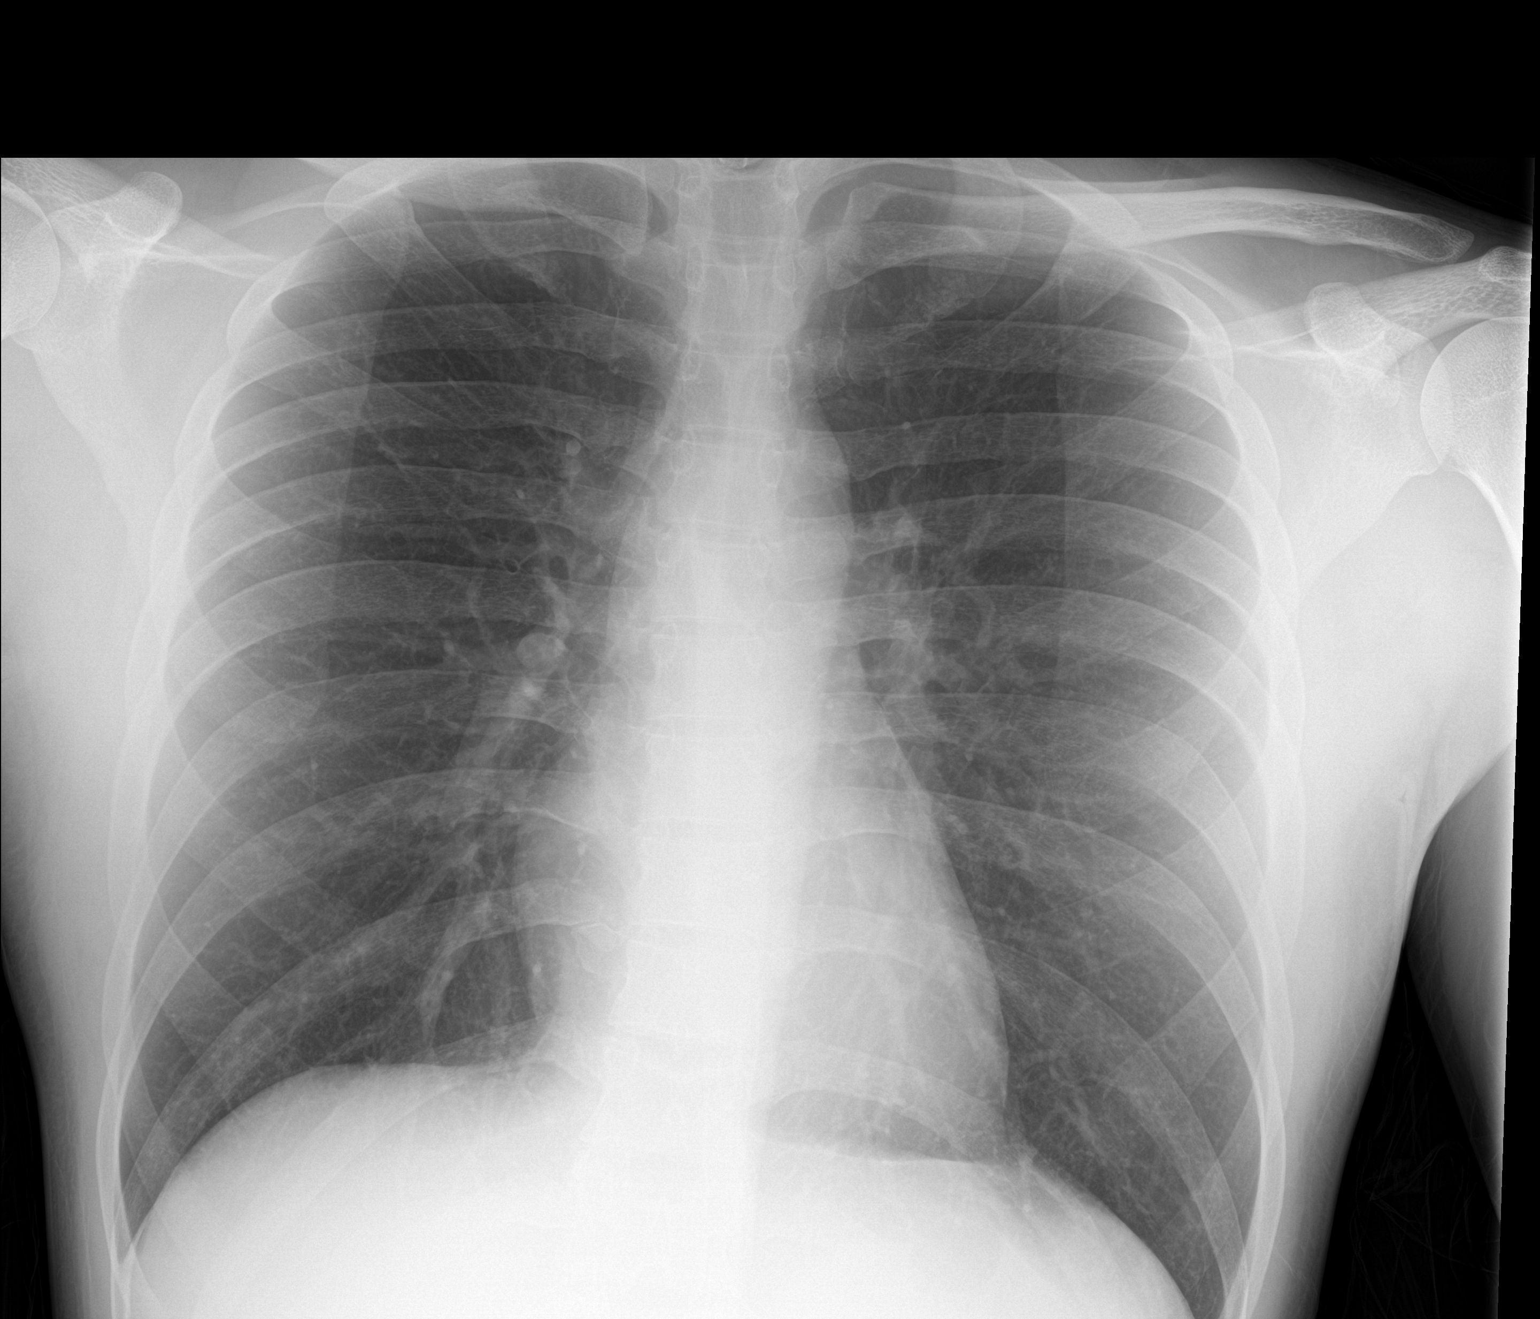

[chest ap (2 of 2)]
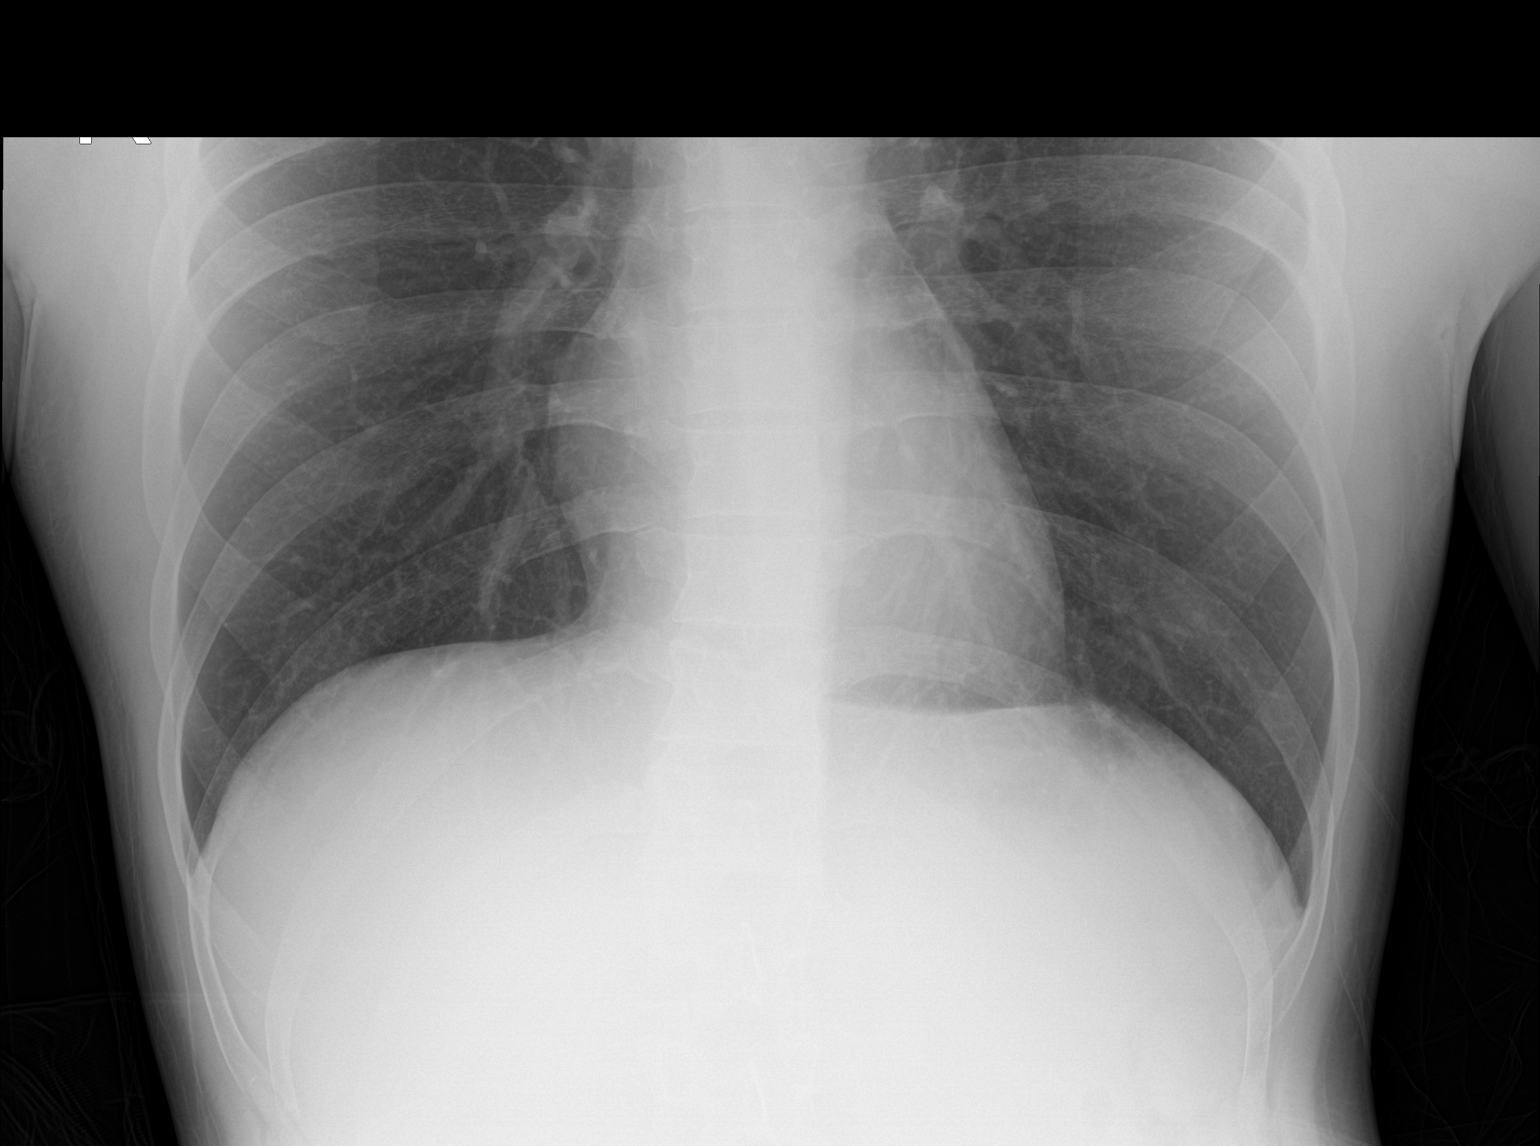

[2 of 2 positions shown; findings below may reference images not displayed]

FINDINGS: The heart size and mediastinal contours are within normal limits.
Both lungs are clear. The visualized skeletal structures are
unremarkable.
IMPRESSION: No active disease.

## 2022-04-30 ENCOUNTER — Other Ambulatory Visit (HOSPITAL_COMMUNITY): Payer: Self-pay

## 2022-04-30 MED ORDER — VALACYCLOVIR HCL 500 MG PO TABS
500.0000 mg | ORAL_TABLET | Freq: Two times a day (BID) | ORAL | 0 refills | Status: AC
  Filled 2022-04-30 (×2): qty 360, 180d supply, fill #0

## 2023-10-07 ENCOUNTER — Other Ambulatory Visit (HOSPITAL_COMMUNITY): Payer: Self-pay

## 2023-11-02 ENCOUNTER — Other Ambulatory Visit (HOSPITAL_COMMUNITY): Payer: Self-pay

## 2023-11-02 MED ORDER — VALACYCLOVIR HCL 500 MG PO TABS
500.0000 mg | ORAL_TABLET | Freq: Two times a day (BID) | ORAL | 0 refills | Status: AC
  Filled 2023-11-02: qty 360, 180d supply, fill #0

## 2023-11-03 ENCOUNTER — Other Ambulatory Visit (HOSPITAL_COMMUNITY): Payer: Self-pay
